# Patient Record
Sex: Male | Born: 1965
Health system: Southern US, Community
[De-identification: ages and names within clinical notes are randomized; demographics above are authoritative.]

## PROBLEM LIST (undated history)

## (undated) DIAGNOSIS — I10 Essential (primary) hypertension: Secondary | ICD-10-CM

## (undated) DIAGNOSIS — K219 Gastro-esophageal reflux disease without esophagitis: Secondary | ICD-10-CM

## (undated) DIAGNOSIS — Z860101 Personal history of adenomatous and serrated colon polyps: Secondary | ICD-10-CM

## (undated) DIAGNOSIS — N529 Male erectile dysfunction, unspecified: Secondary | ICD-10-CM

## (undated) DIAGNOSIS — K2210 Ulcer of esophagus without bleeding: Secondary | ICD-10-CM

## (undated) DIAGNOSIS — N200 Calculus of kidney: Secondary | ICD-10-CM

## (undated) DIAGNOSIS — E785 Hyperlipidemia, unspecified: Secondary | ICD-10-CM

## (undated) DIAGNOSIS — Z86010 Personal history of colonic polyps: Secondary | ICD-10-CM

## (undated) DIAGNOSIS — K449 Diaphragmatic hernia without obstruction or gangrene: Secondary | ICD-10-CM

## (undated) DIAGNOSIS — R7303 Prediabetes: Secondary | ICD-10-CM

## (undated) DIAGNOSIS — J45909 Unspecified asthma, uncomplicated: Secondary | ICD-10-CM

## (undated) DIAGNOSIS — R590 Localized enlarged lymph nodes: Secondary | ICD-10-CM

## (undated) DIAGNOSIS — F17200 Nicotine dependence, unspecified, uncomplicated: Secondary | ICD-10-CM

## (undated) HISTORY — DX: Localized enlarged lymph nodes: R59.0

## (undated) HISTORY — DX: Male erectile dysfunction, unspecified: N52.9

## (undated) HISTORY — DX: Essential (primary) hypertension: I10

## (undated) HISTORY — DX: Ulcer of esophagus without bleeding: K22.10

## (undated) HISTORY — DX: Nicotine dependence, unspecified, uncomplicated: F17.200

## (undated) HISTORY — PX: COLONOSCOPY: SHX174

## (undated) HISTORY — DX: Personal history of adenomatous and serrated colon polyps: Z86.0101

## (undated) HISTORY — DX: Prediabetes: R73.03

## (undated) HISTORY — DX: Gastro-esophageal reflux disease without esophagitis: K21.9

## (undated) HISTORY — DX: Diaphragmatic hernia without obstruction or gangrene: K44.9

## (undated) HISTORY — DX: Hyperlipidemia, unspecified: E78.5

## (undated) HISTORY — DX: Personal history of colonic polyps: Z86.010

## (undated) HISTORY — PX: BACK SURGERY: SHX140

## (undated) HISTORY — DX: Unspecified asthma, uncomplicated: J45.909

## (undated) HISTORY — DX: Calculus of kidney: N20.0

---

## 2009-06-25 HISTORY — PX: ESOPHAGOGASTRODUODENOSCOPY: SHX1529

## 2009-12-23 DIAGNOSIS — N200 Calculus of kidney: Secondary | ICD-10-CM

## 2009-12-23 HISTORY — DX: Calculus of kidney: N20.0

## 2010-01-02 ENCOUNTER — Encounter (INDEPENDENT_AMBULATORY_CARE_PROVIDER_SITE_OTHER): Payer: Self-pay | Admitting: *Deleted

## 2010-02-14 ENCOUNTER — Ambulatory Visit: Payer: Self-pay | Admitting: Internal Medicine

## 2010-02-14 DIAGNOSIS — K219 Gastro-esophageal reflux disease without esophagitis: Secondary | ICD-10-CM | POA: Insufficient documentation

## 2010-02-14 DIAGNOSIS — R1310 Dysphagia, unspecified: Secondary | ICD-10-CM | POA: Insufficient documentation

## 2010-03-15 ENCOUNTER — Ambulatory Visit: Payer: Self-pay | Admitting: Internal Medicine

## 2010-03-15 LAB — CONVERTED CEMR LAB: UREASE: NEGATIVE

## 2010-03-21 ENCOUNTER — Encounter (INDEPENDENT_AMBULATORY_CARE_PROVIDER_SITE_OTHER): Payer: Self-pay | Admitting: *Deleted

## 2010-05-03 ENCOUNTER — Ambulatory Visit: Payer: Self-pay | Admitting: Internal Medicine

## 2010-07-27 NOTE — Assessment & Plan Note (Signed)
Summary: Followup EGD-GERD   History of Present Illness Visit Type: follow up Primary GI MD: Yancey Flemings MD Primary Provider: Herb Grays, MD Chief Complaint: f/u EGD, pt states he has not had any heartburn or reflux since starting omeprazole. History of Present Illness:   Richard Winters presents for followup. He is a 45 year old with hypertension, kidney stones, and chronic back pain. He was evaluated February 14, 2010 regarding reflux and vague dysphagia. See that dictation. He subsequently underwent upper endoscopy on March 15, 2010 while on omeprazole 40 mg daily. Examination revealed erosive esophagitis with edema distally. No Barrett's. A 5 cm hiatal hernia. Mild duodenitis with negative H. pylori testing. He was advised with regards to antireflux regimen, told to quit smoking, and his omeprazole was increased to 40 mg b.i.d. He presents today for followup. Since his last visit, he reports complete resolution of symptoms. No reflux symptoms or any form of dysphasia. He is tolerating the medication well without apparent problems. We discussed reflux disease in detail as well as its treatment and complications. He has not stopped smoking.   GI Review of Systems      Denies abdominal pain, acid reflux, belching, bloating, chest pain, dysphagia with liquids, dysphagia with solids, heartburn, loss of appetite, nausea, vomiting, vomiting blood, weight loss, and  weight gain.        Denies anal fissure, black tarry stools, change in bowel habit, constipation, diarrhea, diverticulosis, fecal incontinence, heme positive stool, hemorrhoids, irritable bowel syndrome, jaundice, light color stool, liver problems, rectal bleeding, and  rectal pain.    Current Medications (verified): 1)  Omeprazole 40 Mg  Cpdr (Omeprazole) .Marland Kitchen.. 1 Twice A Day 30 Minutes Before Meals  Allergies (verified): No Known Drug Allergies  Past History:  Past Medical History: Esophageal Stricture GERD Hypertension Kidney  Stones Hiatal Hernia Erosive esophagitis  Past Surgical History: Reviewed history from 02/14/2010 and no changes required. Back Surgery  Family History: Reviewed history from 02/14/2010 and no changes required. Family History of Ovarian Cancer: Family History of Lung Cancer:  Family History of Celiac Disease: Mother Family History of Diabetes: Grandparents, Mat Uncle Family History of Heart Disease:   Social History: Reviewed history from 02/14/2010 and no changes required. 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 1ppd Alcohol Use - yes weekends Daily Caffeine Use 2-3 day Illicit Drug Use - no  Review of Systems  The patient denies allergy/sinus, anemia, anxiety-new, arthritis/joint pain, back pain, blood in urine, breast changes/lumps, change in vision, confusion, cough, coughing up blood, depression-new, fainting, fatigue, fever, headaches-new, hearing problems, heart murmur, heart rhythm changes, itching, menstrual pain, muscle pains/cramps, night sweats, nosebleeds, pregnancy symptoms, shortness of breath, skin rash, sleeping problems, sore throat, swelling of feet/legs, swollen lymph glands, thirst - excessive , urination - excessive , urination changes/pain, urine leakage, vision changes, and voice change.    Vital Signs:  Patient profile:   45 year old male Height:      73 inches Weight:      208.50 pounds BMI:     27.61 Pulse rate:   90 / minute Pulse rhythm:   regular BP sitting:   116 / 68  (left arm) Cuff size:   regular  Vitals Entered By: Christie Nottingham CMA Duncan Dull) (May 03, 2010 8:42 AM)  Physical Exam  General:  Well developed, well nourished, no acute distress. Head:  Normocephalic and atraumatic. Eyes:  PERRLA, no icterus. Lungs:  Clear throughout to auscultation. Heart:  Regular rate and  rhythm; no murmurs, rubs,  or bruits. Abdomen:  Soft, nontender and nondistended. No masses, hepatosplenomegaly or  hernias noted. Normal bowel sounds. Pulses:  Normal pulses noted. Neurologic:  are normoactive Skin:  no jaundice Psych:  Alert and cooperative. Normal mood and affect.   Impression & Recommendations:  Problem # 1:  GERD (ICD-530.81) GERD with endoscopic evidence of esophagitis and hiatal hernia. Discussion today on the pathophysiology of GERD as well as complications of the disorder and treatment. No further swallowing issues. He did require about decreasing his dosage of omeprazole to once daily.  Plan: #1. Reflux precautions. Literature provided on GERD and reflux precautions #2. Again advised to stop smoking #3. Continue omeprazole 40 mg b.i.d. for one additional month then decrease to 40 mg daily 30 minutes before his breakfast. If he has significant breakthrough symptoms on once daily dosage or recurrent dysphagia-type symptoms, then return to b.i.d. therapy #4. Routine GI followup in one year #5. Ongoing general medical care with Dr. Collins Scotland  Patient Instructions: 1)  GI Reflux brochure given.  2)  GI Reflux precautions given. 3)  Copy sent to : Herb Grays, MD 4)  Follow-up in 1 year. 5)  The medication list was reviewed and reconciled.  All changed / newly prescribed medications were explained.  A complete medication list was provided to the patient / caregiver.

## 2010-07-27 NOTE — Letter (Signed)
Summary: New Patient letter  [facility]  [address] [place], [place] [address]   Phone: [phone]  Fax: [phone]       01/02/2010 MRN: [id number]  [facility] [address] [place], [place]  [address]  Dear Richard Winters,  Welcome to the Gastroenterology Division at [facility].    You are scheduled to see Dr.  Marina Goodell on 02-14-10 at 9:15a.m. on the 3rd floor at [facility], [address]  We ask that you try to arrive at our office 15 minutes prior to your appointment time to allow for check-in.  We would like you to complete the enclosed self-administered evaluation form prior to your visit and bring it with you on the day of your appointment.  We will review it with you.  Also, please bring a complete list of all your medications or, if you prefer, bring the medication bottles and we will list them.  Please bring your insurance card so that we may make a copy of it.  If your insurance requires a referral to see a specialist, please bring your referral form from your primary care physician.  Co-payments are due at the time of your visit and may be paid by cash, check or credit card.     Your office visit will consist of a consult with your physician (includes a physical exam), any laboratory testing he/she may order, scheduling of any necessary diagnostic testing (e.g. x-ray, ultrasound, CT-scan), and scheduling of a procedure (e.g. Endoscopy, Colonoscopy) if required.  Please allow enough time on your schedule to allow for any/all of these possibilities.    If you cannot keep your appointment, please call [phone] to cancel or reschedule prior to your appointment date.  This allows [place] the opportunity to schedule an appointment for another patient in need of care.  If you do not cancel or reschedule by 5 p.m. the business day prior to your appointment date, you will be charged a $50.00 late cancellation/no-show fee.    Thank you for choosing  [facility] for your medical needs.  We appreciate the opportunity to care for you.  Please visit [place] at our website  to learn more about our practice.                     Sincerely,                                                             The Gastroenterology Division

## 2010-07-27 NOTE — Letter (Signed)
Summary: EGD Instructions  [facility]  [address] [place], [place] [address]   Phone: [phone]  Fax: [phone]       Richard Winters    May 06, 1966    MRN: [id number]       Procedure Day /Date:WEDNESDAY, 03/15/10     Arrival Time: 3:00 PM     Procedure Time:4:00 PM     Location of Procedure:                    X_ [facility] (4th Floor)   PREPARATION FOR ENDOSCOPY   On [facility] 9/21 THE DAY OF THE PROCEDURE:  1.   No solid foods, milk or milk products are allowed after midnight the night before your procedure.  2.   Do not drink anything colored red or purple.  Avoid juices with pulp.  No orange juice.  3.  You may drink clear liquids until 2:00 PM, which is 2 hours before your procedure.                                                                                                CLEAR LIQUIDS INCLUDE: Water Jello Ice Popsicles Tea (sugar ok, no milk/cream) Powdered fruit flavored drinks Coffee (sugar ok, no milk/cream) Gatorade Juice: apple, white grape, white cranberry  Lemonade Clear bullion, consomm, broth Carbonated beverages (any kind) Strained chicken noodle soup Hard Candy   MEDICATION INSTRUCTIONS  Unless otherwise instructed, you should take regular prescription medications with a small sip of water as early as possible the morning of your procedure.            OTHER INSTRUCTIONS  You will need a responsible adult at least 44 years of age to accompany you and drive you home.   This person must remain in the waiting room during your procedure.  Wear loose fitting clothing that is easily removed.  Leave jewelry and other valuables at home.  However, you may wish to bring a book to read or an iPod/MP3 player to listen to music as you wait for your procedure to start.  Remove all body piercing jewelry and leave at home.  Total time from sign-in until discharge is approximately 2-3 hours.  You should go home  directly after your procedure and rest.  You can resume normal activities the day after your procedure.  The day of your procedure you should not:   Drive   Make legal decisions   Operate machinery   Drink alcohol   Return to work  You will receive specific instructions about eating, activities and medications before you leave.    The above instructions have been reviewed and explained to me by   _______________________    I fully understand and can verbalize these instructions _____________________________ Date _________

## 2010-07-27 NOTE — Miscellaneous (Signed)
Summary: Omeprazole rx  Clinical Lists Changes  Medications: Added new medication of OMEPRAZOLE 40 MG  CPDR (OMEPRAZOLE) 1 twice a day 30 minutes before meals - Signed Rx of OMEPRAZOLE 40 MG  CPDR (OMEPRAZOLE) 1 twice a day 30 minutes before meals;  #60 x 11;  Signed;  Entered by: Karl Bales RN;  Authorized by: Hilarie Fredrickson MD;  Method used: [facility]  [place] [address], [place], [place]  [address], Ph: [phone] or [phone], Fax: [phone]    Prescriptions: OMEPRAZOLE 40 MG  CPDR (OMEPRAZOLE) 1 twice a day 30 minutes before meals  #60 x 11   Entered by:   Karl Bales RN   Authorized by:   Hilarie Fredrickson MD   Signed by:   Karl Bales RN on 03/15/2010   Method used:   [facility]        [facility]  [place] [address]retail)       [address]       [place], [place]  [address]       Ph: [phone] or [phone]       Fax: [phone]   RxID:   [id number]

## 2010-07-27 NOTE — Procedures (Signed)
Summary: Upper Endoscopy  Patient: [name] Note: All result statuses are Final unless otherwise noted.  Tests: (1) Upper Endoscopy (EGD)   EGD Upper Endoscopy       DONE     [facility]     [address]     [place], [place]  [address]           ENDOSCOPY PROCEDURE REPORT           PATIENT:  Richard Winters, Richard Winters  MR#:  [id number]     BIRTHDATE:  29-Jun-1965, 44 yrs. old  GENDER:  male           ENDOSCOPIST:  Wilhemina Bonito. Eda Keys, MD     Referred by:  Herb Grays, M.D.           PROCEDURE DATE:  03/15/2010     PROCEDURE:  EGD with biopsy     ASA CLASS:  Class I     INDICATIONS:  GERD, dysphagia (globus)           MEDICATIONS:   Fentanyl 100 mcg IV, Versed 10 mg IV     TOPICAL ANESTHETIC:  Exactacain Spray           DESCRIPTION OF PROCEDURE:   After the risks benefits and     alternatives of the procedure were thoroughly explained, informed     consent was obtained.  The LB [id number]-H180 [id number] endoscope was     introduced through the mouth and advanced to the second portion of     the duodenum, without limitations.  The instrument was slowly     withdrawn as the mucosa was fully examined.     <<PROCEDUREIMAGES>>           Erosive Esophagitis with edema was found in the distal esophagus.     No Barrett's.  A 5cm hiatal hernia was present.  Otherwise the     examination of the stomach was normal.  Duodenitis was found in     the bulb of the duodenum. The postbulbar duodenum was normal.CLO     bx taken.    Retroflexed views revealed the hiatal hernia.    The     scope was then withdrawn from the patient and the procedure     completed.           COMPLICATIONS:  None           ENDOSCOPIC IMPRESSION:     1) Erosive Esophagitis in the distal esophagus. No Barrett's     2) 5 cm Hiatal hernia     3) Otherwise normal examination of the stomach     4) Duodenitis in the bulb of duodenum     5) GERD           RECOMMENDATIONS:     1) Anti-reflux regimen to be followed  2) QUIT SMOKING     2) INCREASE OMEPRAZOLE TO 40MG  PO BID (#60 W/ 11 REFILLS)     3) Call office next 2-3 days to schedule an office appointment     WITH DR Marina Goodell FOR 6 WEEKS           ______________________________     Wilhemina Bonito. Eda Keys, MD           CC:  Herb Grays, MD, The Patient           n.     eSIGNEDWilhemina Bonito. Eda Keys at 03/15/2010 02:43 PM  [place], [place], [phone]  Note: An exclamation mark (!) indicates a result that was not dispersed into the flowsheet. Document Creation Date: 03/15/2010 2:44 PM _______________________________________________________________________  (1) Order result status: Final Collection or observation date-time: 03/15/2010 14:31 Requested date-time:  Receipt date-time:  Reported date-time:  Referring Physician:   Ordering Physician: Fransico Setters [phone]) Specimen Source:  Source: Launa Grill Order Number: [id number] Lab site:   Appended Document: Upper Endoscopy ROV made and letter mailed

## 2010-07-27 NOTE — Assessment & Plan Note (Signed)
Summary: problems swollowing--ch.   History of Present Illness Visit Type: new patient Primary GI MD: Yancey Flemings MD Primary Provider: Herb Grays, MD Chief Complaint: no dysphagia, but feels a lump in his throat when swallowing History of Present Illness:   45 year-old white male with hypertension, renal nephrolithiasis, and back pain. He presents today regarding dysphagia. Patient reports having a long history of acid reflux type symptoms manifested by pyrosis and occasional chest discomfort. For this he would used large quantities of TUMS with relief. However less problems recently with reflux-type symptoms. He now mentions a globus type sensation with swallowing he reports the sensation of a lump present. Some vague esophageal dysphagia. He is on the PPI. His weight has been stable. No palmar pain or lower GI complaints. His mother has a history of peptic stricture. The patient smokes. No prior GI evaluations.   GI Review of Systems    Reports acid reflux, belching, chest pain, and  heartburn.      Denies abdominal pain, bloating, dysphagia with liquids, dysphagia with solids, loss of appetite, nausea, vomiting, vomiting blood, weight loss, and  weight gain.        Denies anal fissure, black tarry stools, change in bowel habit, constipation, diarrhea, diverticulosis, fecal incontinence, heme positive stool, hemorrhoids, irritable bowel syndrome, jaundice, light color stool, liver problems, rectal bleeding, and  rectal pain. Preventive Screening-Counseling & Management  Alcohol-Tobacco     Smoking Status: current      Drug Use:  no.      Current Medications (verified): 1)  Vicodin 5-500 Mg Tabs (Hydrocodone-Acetaminophen) .... As Directed For Kidney Stone Pain 2)  Advil 200 Mg Tabs (Ibuprofen) .... 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 1ppd Alcohol Use - yes weekends Daily Caffeine Use 2-3 day Illicit Drug Use - no Smoking Status:  current Drug Use:  no  Review of Systems       The patient complains of back pain and sore throat.  The patient denies allergy/sinus, anemia, anxiety-new, arthritis/joint pain, blood in urine, breast changes/lumps, confusion, cough, coughing up blood, depression-new, fainting, fatigue, fever, headaches-new, hearing problems, heart murmur, heart rhythm changes, itching, muscle pains/cramps, night sweats, nosebleeds, shortness of breath, skin rash, sleeping problems, swelling of feet/legs, swollen lymph glands, thirst - excessive, urination - excessive, urination changes/pain, urine leakage, vision changes, and voice change.    Vital Signs:  Patient profile:   45 year old male Height:      73 inches Weight:      209 pounds BMI:     27.67 Pulse rate:   92 / minute Pulse rhythm:   regular BP sitting:   134 / 90  (left arm) Cuff size:   regular  Vitals Entered By: Francee Piccolo CMA Duncan Dull) (February 14, 2010 9:23 AM)  Physical Exam  General:  Well developed, well nourished, no acute distress. Head:  Normocephalic and atraumatic. Eyes:  PERRLA, no icterus. Ears:  Normal auditory acuity. Nose:  No deformity, discharge,  or lesions. Mouth:  No deformity or lesions, dentition  normal. Neck:  Supple; no masses or thyromegaly. Lungs:  Clear throughout to auscultation. Heart:  Regular rate and rhythm; no murmurs, rubs,  or bruits. Abdomen:  Soft, nontender and nondistended. No masses,  hepatosplenomegaly or hernias noted. Normal bowel sounds. Msk:  Symmetrical with no gross deformities. Normal posture. Pulses:  Normal pulses noted. Extremities:  No clubbing, cyanosis, edema or deformities noted. Neurologic:  Alert and  oriented x4;  grossly normal neurologically. Skin:  Intact without significant lesions or rashes. Psych:  Alert and cooperative. Normal mood and affect.   Impression & Recommendations:  Problem # 1:  DYSPHAGIA UNSPECIFIED (ICD-33.67) 45 year-old with chronic GERD who presents with dysphagia, mostly globus-type symptoms. This may be secondary to GERD. Rule out underlying structural lesion.  Plan:  #1.upper endoscopy with possible esophageal dilation. The nature of the procedure as well as the risks, benefits, and alternatives were reviewed. She understood and agreed to proceed.  Problem # 2:  GERD (ICD-530.81) long-standing GERD symptoms. Seemingly improve nonspecifically. Esophageal symptoms may be secondary to GERD. Rule out underlying peptic stricture. Rule out Barrett's esophagus.  Plan: #1. Reflux precautions #2. Stop smoking #3. Omeprazole 40 mg daily prescribed #4. Upper endoscopy  Other Orders: EGD (EGD)  Patient Instructions: 1)  EGD [facility] 03/15/10 4:00 pm arrive at 3:00 pm 2)  Upper Endoscopy brochure given.  3)  GI Reflux brochure given.  4)  Rx. for Omeprazole 40 mg 1 by mouth once daily #30 x 11 RFs. sent to your pharmacy for you to pick up. 5)  The medication list was reviewed and reconciled.  All changed / newly prescribed medications were explained.  A complete medication list was provided to the patient / caregiver. Prescriptions: OMEPRAZOLE 40 MG CPDR (OMEPRAZOLE) 1 by mouth once daily  #30 x 11   Entered by:   Milford Cage NCMA   Authorized by:   Hilarie Fredrickson MD   Signed by:   Milford Cage NCMA on 02/14/2010   Method used:   [facility]  [place] [address] [facility]       [address]        [place], [place]  [address]       Ph: [phone] or [phone]       Fax: [phone]   RxID:   [id number]

## 2010-07-27 NOTE — Letter (Signed)
Summary: Appt Reminder 2  [facility]  [address] [place], [place] [address]   Phone: [phone]  Fax: [phone]        March 21, 2010 MRN: [id number]    [facility] [address] [place], [place]  [address]    Dear Mr. Pozo,   You have a return appointment with Dr. Marina Goodell on 05/03/10 at 8:45 am.  Please remember to bring a complete list of the medicines you are taking, your insurance card and your co-pay.  If you have to cancel or reschedule this appointment, please call before 5:00 pm the evening before to avoid a cancellation fee.  If you have any questions or concerns, please call [phone].    Sincerely,    Chales Abrahams CMA (AAMA)  Appended Document: Appt Reminder 2 letter mailed

## 2010-07-27 NOTE — Miscellaneous (Signed)
Summary: clotest  Clinical Lists Changes  Orders: Added new Test order of TLB-H Pylori Screen Gastric Biopsy (83013-CLOTEST) - Signed 

## 2011-03-16 ENCOUNTER — Other Ambulatory Visit: Payer: Self-pay | Admitting: Internal Medicine

## 2012-01-25 ENCOUNTER — Ambulatory Visit (INDEPENDENT_AMBULATORY_CARE_PROVIDER_SITE_OTHER): Payer: Managed Care, Other (non HMO) | Admitting: Family Medicine

## 2012-01-25 ENCOUNTER — Encounter: Payer: Self-pay | Admitting: Family Medicine

## 2012-01-25 VITALS — BP 146/93 | HR 70 | Temp 97.7°F | Ht 73.0 in | Wt 212.0 lb

## 2012-01-25 DIAGNOSIS — K8020 Calculus of gallbladder without cholecystitis without obstruction: Secondary | ICD-10-CM

## 2012-01-25 DIAGNOSIS — N200 Calculus of kidney: Secondary | ICD-10-CM

## 2012-01-25 DIAGNOSIS — R590 Localized enlarged lymph nodes: Secondary | ICD-10-CM

## 2012-01-25 DIAGNOSIS — R599 Enlarged lymph nodes, unspecified: Secondary | ICD-10-CM

## 2012-01-25 LAB — CBC WITH DIFFERENTIAL/PLATELET
Eosinophils Relative: 2.8 % (ref 0.0–5.0)
HCT: 49.2 % (ref 39.0–52.0)
Lymphocytes Relative: 27.5 % (ref 12.0–46.0)
Lymphs Abs: 1.8 10*3/uL (ref 0.7–4.0)
Monocytes Relative: 11.9 % (ref 3.0–12.0)
Neutrophils Relative %: 57.2 % (ref 43.0–77.0)
Platelets: 220 10*3/uL (ref 150.0–400.0)
WBC: 6.7 10*3/uL (ref 4.5–10.5)

## 2012-01-25 LAB — COMPREHENSIVE METABOLIC PANEL
CO2: 29 mEq/L (ref 19–32)
Calcium: 9.6 mg/dL (ref 8.4–10.5)
Chloride: 103 mEq/L (ref 96–112)
Creatinine, Ser: 0.8 mg/dL (ref 0.4–1.5)
GFR: 110.94 mL/min (ref >60.00)
Glucose, Bld: 82 mg/dL (ref 70–99)
Total Bilirubin: 1.1 mg/dL (ref 0.3–1.2)

## 2012-01-25 LAB — SEDIMENTATION RATE: Sed Rate: 3 mm/hr (ref 0–22)

## 2012-01-25 LAB — URIC ACID: Uric Acid, Serum: 5.3 mg/dL (ref 4.0–7.8)

## 2012-01-25 NOTE — Assessment & Plan Note (Signed)
Reassured pt that nothing needs to be done about this unless he becomes symptomatic. We reviewed the symptoms of biliary colic.

## 2012-01-25 NOTE — Assessment & Plan Note (Signed)
Recent acute episode, now passed and completely free of pain. He'll keep routine f/u with [facility].

## 2012-01-25 NOTE — Assessment & Plan Note (Signed)
Will obtain the reading of the CT scan he had done at [facility] recently so I can better determine a course of action. Based on current info available to me, I will check iFOB, CBC, CMET, ESR, and uric acid level.

## 2012-01-25 NOTE — Progress Notes (Signed)
Office Note 01/25/2012  CC:  Chief Complaint  Patient presents with  . Establish Care    swollen lymph nodes, gallstones    HPI:  Richard Winters is a 46 y.o. White male who is here to establish care and discuss some issues. Patient's most recent primary MD: [facility] in [place]. Old records (GI notes and EGD in EPIC/HL) were reviewed prior to or during today's visit.  Pt's recent CT scan w/out contrast was reviewed after today's visit.  Describes episode of kidney stones last week, passed it after a couple of days.  He was rx'd rapaflo for short term use, also oxycodone (says he only took 2-3 of these).  He says his HCTZ was also rx'd in distant past for diuretic purposes for his stone dz, not for htn--says he takes it qod b/c he's fearful of the med causing gout.  He maintains that he has white coat HTN, and says he has never had a high bp reading at home or a nonmedical facility. He says the noncontrast CT abd done at [facility] that he had showed "enlarged lymph nodes".  He also was told in the past he has gallstones and denies any sx's suggestive of gallstones.  ROS: no undesired wt loss, no fevers.  Appetite is good.  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 No abd pain, no achiness, no edema, no CP, no SOB, no cough, no URI/ST.  Past Medical History  Diagnosis Date  . Hypertension   . Chest pain   . Dyslipidemia   . Tobacco dependence   . GERD (gastroesophageal reflux disease)     s/p EGD 02/2010-erosive esophagitis, no barrett's, H. pylori neg--responded well to PPI  . Nephrolithiasis     Past Surgical History  Procedure Date  . Back surgery age 2 yrs    Lumbar -2 levels (no hardware)--no signif chronic back problems since  . Esophagogastroduodenoscopy 2011    Esophagitis, H. pylori neg, no Barrett's.    Family History  Problem Relation Age of Onset  . Heart attack Father   . Hypertension Father   . Diabetes Maternal Grandmother   . Diabetes Maternal  Grandfather     History   Social History  . Marital Status: Married    Spouse Name: N/A    Number of Children: N/A  . Years of Education: N/A   Occupational History  . Not on file.   Social History Main Topics  . Smoking status: Current Everyday Smoker  . Smokeless tobacco: Never Used  . Alcohol Use: Yes  . Drug Use: No  . Sexually Active: Not on file   Other Topics Concern  . 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  Occ rides 4 wheelers.Orig from [place] and went to [employer].    Outpatient Encounter Prescriptions as of 01/25/2012  Medication Sig Dispense Refill  . B Complex-C (B-COMPLEX WITH VITAMIN C) tablet Take 1 tablet by mouth daily.      . Coenzyme Q10 200 MG capsule Take 200 mg by mouth daily.      . hydrochlorothiazide (HYDRODIURIL) 25 MG tablet Take 1 tablet by mouth Daily.      Marland Kitchen lisinopril (PRINIVIL,ZESTRIL) 20 MG tablet Take 20 mg by mouth every other day.       . Omega 3-6-9 Fatty Acids (TRIPLE OMEGA-3-6-9) CAPS Take 2 capsules by mouth daily.      Marland Kitchen  omeprazole (PRILOSEC) 40 MG capsule TAKE ONE CAPSULE BY MOUTH TWICE A DAY 30 MINUTES BEFORE MEALS  60 capsule  6  . zinc gluconate 50 MG tablet Take 50 mg by mouth daily.      Marland Kitchen aspirin 81 MG tablet Take 81 mg by mouth daily.        No Known Allergies  ROS Review of Systems  Constitutional: Negative for fever, chills, appetite change and fatigue.  HENT: Negative for ear pain, congestion, sore throat, neck stiffness and dental problem.   Eyes: Negative for discharge, redness and visual disturbance.  Respiratory: Negative for cough, chest tightness, shortness of breath and wheezing.   Cardiovascular: Negative for chest pain, palpitations and leg swelling.  Gastrointestinal: Negative for nausea, vomiting, abdominal pain, diarrhea, constipation, blood  in stool, abdominal distention, anal bleeding and rectal pain.  Genitourinary:       See HPI  Musculoskeletal: Negative for myalgias, back pain, joint swelling and arthralgias.  Skin: Negative for pallor and rash.  Neurological: Negative for dizziness, speech difficulty, weakness and headaches.  Hematological: Negative for adenopathy. Does not bruise/bleed easily.  Psychiatric/Behavioral: Negative for confusion and disturbed wake/sleep cycle. The patient is not nervous/anxious.     PE; Blood pressure 146/93, pulse 70, temperature 97.7 F (36.5 C), temperature source Temporal, height 6\' 1"  (1.854 m), weight 212 lb (96.163 kg), SpO2 99.00%. Gen: Alert, well appearing.  Patient is oriented to person, place, time, and situation. Affect: pleasant, lucid thought and speech ENT: Ears: EACs clear, normal epithelium.  TMs with good light reflex and landmarks bilaterally.  Eyes: no injection, icteris, swelling, or exudate.  EOMI, PERRLA. Nose: no drainage or turbinate edema/swelling.  No injection or focal lesion.  Mouth: lips without lesion/swelling.  Oral mucosa pink and moist.  Dentition intact and without obvious caries or gingival swelling.  Oropharynx without erythema, exudate, or swelling.  Neck - No masses or thyromegaly or limitation in range of motion CV: RRR, no m/r/g.   LUNGS: CTA bilat, nonlabored resps, good aeration in all lung fields. ABD: soft, NT, ND, BS normal.  No hepatospenomegaly or mass.  No bruits. EXT: no clubbing, cyanosis, or edema.   Pertinent labs:  None today  ASSESSMENT AND PLAN:   New pt: obtain old records  Nephrolithiasis Recent acute episode, now passed and completely free of pain. He'll keep routine f/u with [facility].  Abdominal lymphadenopathy Will obtain the reading of the CT scan he had done at [facility] recently so I can better determine a course of action. Based on current info available to me, I will check iFOB, CBC, CMET, ESR, and  uric acid level.  Asymptomatic gallstones Reassured pt that nothing needs to be done about this unless he becomes symptomatic. We reviewed the symptoms of biliary colic.    Return in about 6 months (around 07/27/2012) for f/u mesenteric LAD; needs repeat CT abd/pelvis.   ADDENDUM 01/26/12: I reviewed the noncontrast CT done 01/16/12 at [facility].  All was normal except a nonobstructing stone in left kidney and a stone at the left UP jxn. He was also noted to have borderline enlarged mesenteric lymph nodes in the ileocolic area.  Radiologist commented that this was very likely reactive from prior infectious/inflammitory process, but lymphoproliferative disorder (lymphoma) or metastatic adenopathy could not completely be excluded. It was recommended to get hemoccult testing and repeat CT scan in 6 mo. I called the patient today and notified him of these results and our plan is to have  him return for o/v in 50mo and see how he's doing and then order the repeat CT scan.---PM

## 2012-01-31 ENCOUNTER — Encounter: Payer: Self-pay | Admitting: Family Medicine

## 2012-02-18 ENCOUNTER — Other Ambulatory Visit: Payer: Managed Care, Other (non HMO)

## 2012-02-18 DIAGNOSIS — R590 Localized enlarged lymph nodes: Secondary | ICD-10-CM

## 2012-02-18 LAB — FECAL OCCULT BLOOD, IMMUNOCHEMICAL: Fecal Occult Bld: NEGATIVE

## 2012-04-10 ENCOUNTER — Other Ambulatory Visit: Payer: Self-pay | Admitting: Internal Medicine

## 2012-05-06 ENCOUNTER — Ambulatory Visit (INDEPENDENT_AMBULATORY_CARE_PROVIDER_SITE_OTHER): Payer: Managed Care, Other (non HMO) | Admitting: Family Medicine

## 2012-05-06 ENCOUNTER — Encounter: Payer: Self-pay | Admitting: Family Medicine

## 2012-05-06 VITALS — BP 142/87 | HR 68 | Ht 73.0 in | Wt 219.0 lb

## 2012-05-06 DIAGNOSIS — R198 Other specified symptoms and signs involving the digestive system and abdomen: Secondary | ICD-10-CM

## 2012-05-06 NOTE — Progress Notes (Signed)
OFFICE NOTE  05/08/2012  CC:  Chief Complaint  Patient presents with  . Cholelithiasis    worse since Friday, worse when eating or sitting     HPI: Patient is a 46 y.o. Caucasian male who is here for abdominal complaints. Vague report: no pain, but mild RUQ fullness with lifting/straining and as he eats.  No nausea. He is able to eat fine.  He just finished 7d cruise and ate a lot.  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  He was worried b/c he thought he remembered being told that he had gallstones seen on his CT scan of abd w/out contrast 12/2011 done for eval of kidney stones.   However, in review of this report today there is mention of NORMAL GB.  Pertinent PMH:  Past Medical History  Diagnosis Date  . Elevated blood pressure reading without diagnosis of hypertension     white coat HTN?  Marland Kitchen Chest pain   . Dyslipidemia   . Tobacco dependence   . GERD (gastroesophageal reflux disease)     s/p EGD 02/2010-erosive esophagitis, no barrett's, H. pylori neg--responded well to PPI  . Nephrolithiasis     with hypercalcuria (treated w/HCTZ)    MEDS:  Outpatient Prescriptions Prior to Visit  Medication Sig Dispense Refill  . aspirin 81 MG tablet Take 81 mg by mouth every other day.       . B Complex-C (B-COMPLEX WITH VITAMIN C) tablet Take 1 tablet by mouth daily.      . Coenzyme Q10 200 MG capsule Take 200 mg by mouth daily.      . hydrochlorothiazide (HYDRODIURIL) 25 MG tablet Take 1 tablet by mouth every other day.       . Omega 3-6-9 Fatty Acids (TRIPLE OMEGA-3-6-9) CAPS Take 2 capsules by mouth daily.      Marland Kitchen omeprazole (PRILOSEC) 40 MG capsule TAKE ONE CAPSULE BY MOUTH TWICE A DAY 30 MINUTES BEFORE MEALS  60 capsule  0  . zinc gluconate 50 MG tablet Take 50 mg by mouth daily.      Marland Kitchen lisinopril (PRINIVIL,ZESTRIL) 20 MG tablet Take 20 mg by mouth every other day.       NOTE**He is not taking lisinopril as listed  above  PE: Blood pressure 142/87, pulse 68, height 6\' 1"  (1.854 m), weight 219 lb (99.338 kg). Gen: Alert, well appearing.  Patient is oriented to person, place, time, and situation. ENT:  Eyes: no injection, icteris, swelling, or exudate.   Oral mucosa pink and moist.    Oropharynx without erythema, exudate, or swelling.  Neck - No masses or thyromegaly or limitation in range of motion CV: RRR, no m/r/g.   LUNGS: CTA bilat, nonlabored resps, good aeration in all lung fields. ABD: soft, NT, +rotund/obese but ND, BS normal.  No hepatospenomegaly or mass.  No bruits. EXT: no clubbing, cyanosis, or edema.   LABS: none today  IMPRESSION AND PLAN:  Abdominal fullness in right upper quadrant I think he is experiencing some dyspepsia sx's and sx's compatible with his small sliding hiatal hernia. We reviewed his CT w/out contrast from 12/2011 and noted normal GB and liver. He had a bit of ileocolic mesenteric lymph nodes that were borderline in size and we plan on doing f/u CT as recommended by radiologist (hemoccults were neg) approx Jan/Feb 2014.   Reassured pt.  Continue daily prilosec.  Avoid overeating, avoid any potentially culprit foods. Call or  return if getting more discrete postprandial RUQ or mid epigastric pain, nausea, or vomiting.   He declined flu vaccine today.  An After Visit Summary was printed and given to the patient.  FOLLOW UP: 1-2 mo

## 2012-05-08 ENCOUNTER — Encounter: Payer: Self-pay | Admitting: Family Medicine

## 2012-05-08 NOTE — Assessment & Plan Note (Addendum)
I think he is experiencing some dyspepsia sx's and sx's compatible with his small sliding hiatal hernia. We reviewed his CT w/out contrast from 12/2011 and noted normal GB and liver. He had a bit of ileocolic mesenteric lymph nodes that were borderline in size and we plan on doing f/u CT as recommended by radiologist (hemoccults were neg) approx Jan/Feb 2014.   Reassured pt.  Continue daily prilosec.  Avoid overeating, avoid any potentially culprit foods. Call or return if getting more discrete postprandial RUQ or mid epigastric pain, nausea, or vomiting.

## 2012-05-14 ENCOUNTER — Other Ambulatory Visit: Payer: Self-pay | Admitting: Internal Medicine

## 2012-06-04 ENCOUNTER — Ambulatory Visit: Payer: Managed Care, Other (non HMO) | Admitting: Internal Medicine

## 2012-06-19 ENCOUNTER — Ambulatory Visit: Payer: Managed Care, Other (non HMO) | Admitting: Internal Medicine

## 2012-07-07 ENCOUNTER — Encounter: Payer: Self-pay | Admitting: Family Medicine

## 2012-07-07 ENCOUNTER — Ambulatory Visit (INDEPENDENT_AMBULATORY_CARE_PROVIDER_SITE_OTHER): Payer: Managed Care, Other (non HMO) | Admitting: Family Medicine

## 2012-07-07 VITALS — BP 125/83 | HR 72 | Ht 73.0 in | Wt 218.0 lb

## 2012-07-07 DIAGNOSIS — R599 Enlarged lymph nodes, unspecified: Secondary | ICD-10-CM

## 2012-07-07 DIAGNOSIS — K219 Gastro-esophageal reflux disease without esophagitis: Secondary | ICD-10-CM

## 2012-07-07 DIAGNOSIS — I880 Nonspecific mesenteric lymphadenitis: Secondary | ICD-10-CM

## 2012-07-07 DIAGNOSIS — R590 Localized enlarged lymph nodes: Secondary | ICD-10-CM

## 2012-07-07 MED ORDER — OMEPRAZOLE 40 MG PO CPDR: 40.0000 mg | DELAYED_RELEASE_CAPSULE | Freq: Every day | ORAL | Status: DC

## 2012-07-07 NOTE — Progress Notes (Signed)
OFFICE NOTE  07/07/2012  CC:  Chief Complaint  Patient presents with  . Follow-up    CT scan     HPI: Patient is a 47 y.o. Caucasian male who is here for 6 mo f/u abnl abdominal CT--borderline mesenteric lymphadenopathy noted, thought to be reactive but f/u CT in 78mo was recommended. Since last visit 2 mo/ago he has had much less epigastric/RUQ pain and he attributes this to taking his lunch to work daily.  He has cut back his omeprazole from bid to qd.  "I can tell when I miss it". Appetite is good.  No unexplained fevers, night sweats, or wt loss.  Pertinent PMH:  Past Medical History  Diagnosis Date  . HTN (hypertension)   . Chest pain   . Dyslipidemia   . Tobacco dependence   . GERD (gastroesophageal reflux disease)     s/p EGD 02/2010-erosive esophagitis, no barrett's, H. pylori neg--responded well to PPI  . Nephrolithiasis 12/2009    with hypercalcuria (treated w/HCTZ)  . Hiatal hernia   . Erosive esophagitis     MEDS:  Outpatient Prescriptions Prior to Visit  Medication Sig Dispense Refill  . aspirin 81 MG tablet Take 81 mg by mouth every other day.       . B Complex-C (B-COMPLEX WITH VITAMIN C) tablet Take 1 tablet by mouth daily.      . Coenzyme Q10 200 MG capsule Take 200 mg by mouth daily.      . hydrochlorothiazide (HYDRODIURIL) 25 MG tablet Take 1 tablet by mouth every other day.       . Omega 3-6-9 Fatty Acids (TRIPLE OMEGA-3-6-9) CAPS Take 2 capsules by mouth daily.      Marland Kitchen omeprazole (PRILOSEC) 40 MG capsule TAKE ONE CAPSULE BY MOUTH TWICE A DAY 30 MINS BEFORE MEALS  60 capsule  0  . zinc gluconate 50 MG tablet Take 50 mg by mouth daily.       Last reviewed on 05/06/2012  8:22 AM by Jeoffrey Massed, MD  PE: Blood pressure 125/83, pulse 72, height 6\' 1"  (1.854 m), weight 218 lb (98.884 kg). Gen: Alert, well appearing.  Patient is oriented to person, place, time, and situation. CV: RRR, no m/r/g.   LUNGS: CTA bilat, nonlabored resps, good aeration in all  lung fields. ABD: soft, NT, ND, BS normal.  No hepatospenomegaly or mass.  No bruits.  IMPRESSION AND PLAN:  Abdominal lymphadenopathy Asymptomatic. Time for a 78mo f/u CT abd/pelv--ordered today.  GERD Hx of esophagitis approx 2 yrs ago on EGD. This has greatly improved and he is on once daily prilosec. Dietary adjustments have been made as well. RF'd prilosec 40mg  qd today.   An After Visit Summary was printed and given to the patient.  FOLLOW UP:  1 yr

## 2012-07-07 NOTE — Assessment & Plan Note (Signed)
Hx of esophagitis approx 2 yrs ago on EGD. This has greatly improved and he is on once daily prilosec. Dietary adjustments have been made as well. RF'd prilosec 40mg  qd today.

## 2012-07-07 NOTE — Assessment & Plan Note (Signed)
Asymptomatic. Time for a 51mo f/u CT abd/pelv--ordered today.

## 2012-07-11 ENCOUNTER — Ambulatory Visit: Payer: Managed Care, Other (non HMO) | Admitting: Internal Medicine

## 2012-07-17 ENCOUNTER — Telehealth: Payer: Self-pay | Admitting: Family Medicine

## 2012-07-17 NOTE — Telephone Encounter (Signed)
Pt's wife in today for visit and she asked about result of Richard Winters CT abd done about 4d/a. Apparently this was ordered for med [facility] but his insurance required it to be done at [facility] in [place]. We never got a result sent to [place].  We got the result faxed to [place] today while his wife was here and (ok per his DPR) we gave her the result and she said she would tell him: no change in previously noted lymph nodes, not pathologically enlarged.  Prior infectious or inflammitory etiology is favored, less likely neoplastic. I told her no further imaging was needed unless symptoms occurred.   Will place faxed report in patient's chart.

## 2012-10-08 ENCOUNTER — Encounter: Payer: Self-pay | Admitting: Family Medicine

## 2012-10-08 ENCOUNTER — Ambulatory Visit (INDEPENDENT_AMBULATORY_CARE_PROVIDER_SITE_OTHER): Payer: Managed Care, Other (non HMO) | Admitting: Family Medicine

## 2012-10-08 VITALS — BP 124/80 | HR 63 | Temp 97.8°F | Ht 73.0 in | Wt 217.2 lb

## 2012-10-08 DIAGNOSIS — B351 Tinea unguium: Secondary | ICD-10-CM | POA: Insufficient documentation

## 2012-10-08 DIAGNOSIS — N529 Male erectile dysfunction, unspecified: Secondary | ICD-10-CM | POA: Insufficient documentation

## 2012-10-08 MED ORDER — TERBINAFINE HCL 250 MG PO TABS: 250.0000 mg | ORAL_TABLET | Freq: Every day | ORAL | Status: DC

## 2012-10-08 MED ORDER — SILDENAFIL CITRATE 100 MG PO TABS: 100.0000 mg | ORAL_TABLET | Freq: Every day | ORAL | Status: DC | PRN

## 2012-10-08 NOTE — Progress Notes (Signed)
OFFICE NOTE  10/08/2012  CC: No chief complaint on file.   HPI: Patient is a 47 y.o. Caucasian male who is here discussion of toenail problem. Thickened great toenails with white substance underneath for about 1 yr.  Other nails ok. Has had this treated successfully in the past and it has been back now for about a year. 01/2012 LFTs normal.  ED: erection ok at first but it won't last, intermittently everything is ok.  Has been a problem for about a year. Sexual desire intact.  Says he has never tried any ED med in the past.  Pertinent PMH:  Past Medical History  Diagnosis Date  . HTN (hypertension)   . Chest pain   . Dyslipidemia   . Tobacco dependence   . GERD (gastroesophageal reflux disease)     s/p EGD 02/2010-erosive esophagitis, no barrett's, H. pylori neg--responded well to PPI  . Nephrolithiasis 12/2009    with hypercalcuria (treated w/HCTZ)  . Hiatal hernia   . Erosive esophagitis   . Abdominal lymphadenopathy     CT f/u 20mo later (06/2012) all was stable: interpreted as reactive/post-infectious-no further f/u indicated.    MEDS:  Outpatient Prescriptions Prior to Visit  Medication Sig Dispense Refill  . aspirin 81 MG tablet Take 81 mg by mouth every other day.       . B Complex-C (B-COMPLEX WITH VITAMIN C) tablet Take 1 tablet by mouth daily.      . Coenzyme Q10 200 MG capsule Take 200 mg by mouth daily.      . hydrochlorothiazide (HYDRODIURIL) 25 MG tablet Take 1 tablet by mouth every other day.       . Omega 3-6-9 Fatty Acids (TRIPLE OMEGA-3-6-9) CAPS Take 2 capsules by mouth daily.      Marland Kitchen omeprazole (PRILOSEC) 40 MG capsule Take 1 capsule (40 mg total) by mouth daily.  90 capsule  1  . zinc gluconate 50 MG tablet Take 50 mg by mouth daily.       No facility-administered medications prior to visit.    PE: Blood pressure 124/80, pulse 63, temperature 97.8 F (36.6 C), temperature source Oral, height 6\' 1"  (1.854 m), weight 217 lb 4 oz (98.544 kg), SpO2  97.00%. Gen: Alert, well appearing.  Patient is oriented to person, place, time, and situation. FEET: both great toes with thickened nails with extensive white substance beneath nails.  Other toenails looked normal.  Feet without rash.  IMPRESSION AND PLAN:  Onychomycosis Lamisil 250mg  qd x 3 mo. Therapeutic expectations and side effect profile of medication discussed today.  Patient's questions answered.   Erectile dysfunction Trial of viagra; coupon for 3 free pills given today, rx for #3 with 3 RFs.   An After Visit Summary was printed and given to the patient.   FOLLOW UP: 20mo f/u HTN

## 2012-10-08 NOTE — Assessment & Plan Note (Signed)
Lamisil 250mg  qd x 3 mo. Therapeutic expectations and side effect profile of medication discussed today.  Patient's questions answered.

## 2012-10-08 NOTE — Assessment & Plan Note (Signed)
Trial of viagra; coupon for 3 free pills given today, rx for #3 with 3 RFs.

## 2013-01-26 ENCOUNTER — Other Ambulatory Visit: Payer: Self-pay | Admitting: Family Medicine

## 2013-01-26 MED ORDER — SILDENAFIL CITRATE 100 MG PO TABS: 100.0000 mg | ORAL_TABLET | Freq: Every day | ORAL | Status: DC | PRN

## 2013-01-26 NOTE — Telephone Encounter (Signed)
Patient seen on 10/08/12.  Refilled viagra 100mg  for 12 months per protocol.

## 2013-02-02 ENCOUNTER — Other Ambulatory Visit: Payer: Self-pay | Admitting: Family Medicine

## 2013-02-02 MED ORDER — OMEPRAZOLE 40 MG PO CPDR: 40.0000 mg | DELAYED_RELEASE_CAPSULE | Freq: Every day | ORAL | Status: DC

## 2013-02-18 ENCOUNTER — Ambulatory Visit (INDEPENDENT_AMBULATORY_CARE_PROVIDER_SITE_OTHER): Payer: Managed Care, Other (non HMO) | Admitting: Family Medicine

## 2013-02-18 ENCOUNTER — Encounter: Payer: Self-pay | Admitting: Family Medicine

## 2013-02-18 VITALS — BP 127/85 | HR 71 | Temp 98.6°F | Resp 18 | Ht 73.0 in | Wt 217.0 lb

## 2013-02-18 DIAGNOSIS — L309 Dermatitis, unspecified: Secondary | ICD-10-CM | POA: Insufficient documentation

## 2013-02-18 DIAGNOSIS — B351 Tinea unguium: Secondary | ICD-10-CM

## 2013-02-18 DIAGNOSIS — L259 Unspecified contact dermatitis, unspecified cause: Secondary | ICD-10-CM

## 2013-02-18 DIAGNOSIS — N529 Male erectile dysfunction, unspecified: Secondary | ICD-10-CM

## 2013-02-18 MED ORDER — SILDENAFIL CITRATE 50 MG PO TABS: 50.0000 mg | ORAL_TABLET | Freq: Every day | ORAL | Status: DC | PRN

## 2013-02-18 NOTE — Progress Notes (Signed)
OFFICE NOTE  02/18/2013  CC:  Chief Complaint  Patient presents with  . Follow-up     HPI: Patient is a 47 y.o. white male who is here for 4 mo f/u onychomycosis + tinea cruris s/p lamisil treatment. Nails improved.  No probs with lamisil. Viagra trial successful, 50mg  dose fine.   Pertinent PMH:  Past Medical History  Diagnosis Date  . HTN (hypertension)   . Chest pain   . Dyslipidemia   . Tobacco dependence   . GERD (gastroesophageal reflux disease)     s/p EGD 02/2010-erosive esophagitis, no barrett's, H. pylori neg--responded well to PPI  . Nephrolithiasis 12/2009    with hypercalcuria (treated w/HCTZ)  . Hiatal hernia   . Erosive esophagitis   . Abdominal lymphadenopathy     CT f/u 38mo later (06/2012) all was stable: interpreted as reactive/post-infectious-no further f/u indicated.   Past surgical, social, and family history reviewed and no changes noted since last office visit.  MEDS:  Outpatient Prescriptions Prior to Visit  Medication Sig Dispense Refill  . B Complex-C (B-COMPLEX WITH VITAMIN C) tablet Take 1 tablet by mouth daily.      . Coenzyme Q10 200 MG capsule Take 200 mg by mouth daily.      . hydrochlorothiazide (HYDRODIURIL) 25 MG tablet Take 1 tablet by mouth every other day.       . Omega 3-6-9 Fatty Acids (TRIPLE OMEGA-3-6-9) CAPS Take 2 capsules by mouth daily.      Marland Kitchen omeprazole (PRILOSEC) 40 MG capsule Take 1 capsule (40 mg total) by mouth daily.  90 capsule  0  . sildenafil (VIAGRA) 100 MG tablet Take 1 tablet (100 mg total) by mouth daily as needed for erectile dysfunction.  3 tablet  12  . terbinafine (LAMISIL) 250 MG tablet Take 1 tablet (250 mg total) by mouth daily.  90 tablet  0  . zinc gluconate 50 MG tablet Take 50 mg by mouth daily.      Marland Kitchen aspirin 81 MG tablet Take 81 mg by mouth every other day.        No facility-administered medications prior to visit.    PE: Blood pressure 127/85, pulse 71, temperature 98.6 F (37 C), temperature  source Temporal, resp. rate 18, height 6\' 1"  (1.854 m), weight 217 lb (98.431 kg), SpO2 97.00%. Gen: Alert, well appearing.  Patient is oriented to person, place, time, and situation. Toenails: no thickening or white substance beneath nails.  IMPRESSION AND PLAN:  1) Onychomycosis, resolved s/p lamisil treatment.  2) Erectile dysfunction: +response to viagra 50-100mg .  Rx for this given today.  FOLLOW UP: prn

## 2013-02-18 NOTE — Patient Instructions (Signed)
Buy OTC diaper cream called boudreaux's Butt Paste and apply liberally twice per day to affected areas. Air-out the region as much as possible.

## 2013-04-30 ENCOUNTER — Other Ambulatory Visit: Payer: Self-pay

## 2013-05-01 ENCOUNTER — Other Ambulatory Visit: Payer: Self-pay | Admitting: Family Medicine

## 2013-05-01 MED ORDER — OMEPRAZOLE 40 MG PO CPDR: 40.0000 mg | DELAYED_RELEASE_CAPSULE | Freq: Every day | ORAL | Status: DC

## 2013-05-12 ENCOUNTER — Other Ambulatory Visit: Payer: Managed Care, Other (non HMO)

## 2013-05-18 ENCOUNTER — Encounter: Payer: Managed Care, Other (non HMO) | Admitting: Family Medicine

## 2013-06-11 ENCOUNTER — Other Ambulatory Visit (INDEPENDENT_AMBULATORY_CARE_PROVIDER_SITE_OTHER): Payer: Managed Care, Other (non HMO)

## 2013-06-11 DIAGNOSIS — Z0000 Encounter for general adult medical examination without abnormal findings: Secondary | ICD-10-CM

## 2013-06-11 LAB — COMPREHENSIVE METABOLIC PANEL
ALT: 40 U/L (ref 0–53)
AST: 26 U/L (ref 0–37)
Alkaline Phosphatase: 77 U/L (ref 39–117)
BUN: 16 mg/dL (ref 6–23)
CO2: 28 mEq/L (ref 19–32)
Calcium: 9.4 mg/dL (ref 8.4–10.5)
Chloride: 105 mEq/L (ref 96–112)
Creatinine, Ser: 0.8 mg/dL (ref 0.4–1.5)
Total Bilirubin: 0.5 mg/dL (ref 0.3–1.2)

## 2013-06-11 LAB — CBC WITH DIFFERENTIAL/PLATELET
Basophils Absolute: 0.1 10*3/uL (ref 0.0–0.1)
Basophils Relative: 0.7 % (ref 0.0–3.0)
Eosinophils Absolute: 0.2 10*3/uL (ref 0.0–0.7)
Hemoglobin: 16.5 g/dL (ref 13.0–17.0)
Lymphocytes Relative: 25.2 % (ref 12.0–46.0)
MCHC: 33.8 g/dL (ref 30.0–36.0)
MCV: 85.4 fl (ref 78.0–100.0)
Monocytes Absolute: 0.7 10*3/uL (ref 0.1–1.0)
Monocytes Relative: 8.7 % (ref 3.0–12.0)
Neutro Abs: 4.9 10*3/uL (ref 1.4–7.7)
Neutrophils Relative %: 62.3 % (ref 43.0–77.0)
RBC: 5.73 Mil/uL (ref 4.22–5.81)
RDW: 14.7 % — ABNORMAL HIGH (ref 11.5–14.6)

## 2013-06-11 LAB — LIPID PANEL
Cholesterol: 169 mg/dL (ref 0–200)
HDL: 30.1 mg/dL — ABNORMAL LOW (ref >39.00)
LDL Cholesterol: 119 mg/dL — ABNORMAL HIGH (ref 0–99)
Total CHOL/HDL Ratio: 6
Triglycerides: 101 mg/dL (ref 0.0–149.0)
VLDL: 20.2 mg/dL (ref 0.0–40.0)

## 2013-06-11 LAB — TSH: TSH: 0.86 u[IU]/mL (ref 0.35–5.50)

## 2013-06-19 ENCOUNTER — Encounter: Payer: Self-pay | Admitting: Family Medicine

## 2013-06-19 ENCOUNTER — Ambulatory Visit (INDEPENDENT_AMBULATORY_CARE_PROVIDER_SITE_OTHER): Payer: Managed Care, Other (non HMO) | Admitting: Family Medicine

## 2013-06-19 VITALS — BP 129/85 | HR 84 | Temp 98.2°F | Resp 18 | Ht 73.0 in | Wt 215.0 lb

## 2013-06-19 DIAGNOSIS — E782 Mixed hyperlipidemia: Secondary | ICD-10-CM

## 2013-06-19 DIAGNOSIS — R7301 Impaired fasting glucose: Secondary | ICD-10-CM

## 2013-06-19 DIAGNOSIS — Z0000 Encounter for general adult medical examination without abnormal findings: Secondary | ICD-10-CM

## 2013-06-19 DIAGNOSIS — Z23 Encounter for immunization: Secondary | ICD-10-CM

## 2013-06-19 NOTE — Assessment & Plan Note (Addendum)
Reviewed age and gender appropriate health maintenance issues (prudent diet, regular exercise, health risks of tobacco and excessive alcohol, use of seatbelts, fire alarms in home, use of sunscreen).  Also reviewed age and gender appropriate health screening as well as vaccine recommendations. Flu and Tdap given IM today.   Recent glucose--not technically fasting--was 130, and HDL low/LDL high but not by a major amount. We discussed approach to this today and agreed TLC was certainly in order but meds for further labs at this point in time were not needed. Will refer to nutritionist.  Encouraged exercise.  Encouraged complete smoking cessation (he is currently using e-cigs as a crutch but is also continuing to smoke cigs). F/u in 3 mo for office recheck and we'll do fasting glucose and lipids at that time.

## 2013-06-19 NOTE — Progress Notes (Signed)
Office Note 06/19/2013  CC:  Chief Complaint  Patient presents with  . Annual Exam    HPI:  Richard Winters is a 47 y.o. White male who is here today for annual CPE. Reviewed recent health panel in detail with pt today.  He admits he had some coffee with sugar and creamer in it before having his blood drawn.  He is fairly active during his work day but otherwise does no exercise. He is not restricting his diet at all but says he plans on starting to limit all fast food to "subway only".   Past Medical History  Diagnosis Date  . HTN (hypertension)   . Chest pain   . Dyslipidemia   . Tobacco dependence   . GERD (gastroesophageal reflux disease)     s/p EGD 02/2010-erosive esophagitis, no barrett's, H. pylori neg--responded well to PPI  . Nephrolithiasis 12/2009    with hypercalcuria (treated w/HCTZ)  . Hiatal hernia   . Erosive esophagitis   . Abdominal lymphadenopathy     CT f/u 52mo later (06/2012) all was stable: interpreted as reactive/post-infectious-no further f/u indicated.    Past Surgical History  Procedure Laterality Date  . Back surgery  age 24 yrs    Lumbar -2 levels (no hardware)--no signif chronic back problems since  . Esophagogastroduodenoscopy  2011    Esophagitis, H. pylori neg, no Barrett's.    Family History  Problem Relation Age of Onset  . Heart attack Father   . Hypertension Father   . Diabetes Maternal Grandmother   . Diabetes Maternal Grandfather     History   Social History  . Marital Status: Married    Spouse Name: N/A    Number of Children: N/A  . Years of Education: N/A   Occupational History  . Not on file.   Social History Main Topics  . Smoking status: Current Every Day Smoker  . Smokeless tobacco: Never Used  . Alcohol Use: Yes  . Drug Use: No  . Sexual Activity: Not on file   Other Topics Concern  . Not on file   Social History Narrative   Married, 1 son and 1 daughter.   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   +cigarettes: 30 pack-yr hx.   Alcohol: occasional beer, rarely whisky.   No drugs.  Occ rides 4 wheelers.   Orig from [place] and went to [employer].    Outpatient Prescriptions Prior to Visit  Medication Sig Dispense Refill  . B Complex-C (B-COMPLEX WITH VITAMIN C) tablet Take 1 tablet by mouth daily.      . Coenzyme Q10 200 MG capsule Take 200 mg by mouth daily.      . hydrochlorothiazide (HYDRODIURIL) 25 MG tablet Take 1 tablet by mouth every other day.       . Omega 3-6-9 Fatty Acids (TRIPLE OMEGA-3-6-9) CAPS Take 2 capsules by mouth daily.      Marland Kitchen omeprazole (PRILOSEC) 40 MG capsule Take 1 capsule (40 mg total) by mouth daily.  90 capsule  1  . sildenafil (VIAGRA) 100 MG tablet Take 1 tablet (100 mg total) by mouth daily as needed for erectile dysfunction.  3 tablet  12  . sildenafil (VIAGRA) 50 MG tablet Take 1 tablet (50 mg total) by mouth daily as needed for erectile dysfunction.  6 tablet  6  . zinc gluconate 50 MG tablet Take 50 mg by mouth daily.      Marland Kitchen aspirin 81 MG tablet Take 81  mg by mouth every other day.       . terbinafine (LAMISIL) 250 MG tablet Take 1 tablet (250 mg total) by mouth daily.  90 tablet  0   No facility-administered medications prior to visit.    No Known Allergies  ROS Review of Systems  Constitutional: Negative for fever, chills, appetite change and fatigue.  HENT: Negative for congestion, dental problem, ear pain and sore throat.   Eyes: Negative for discharge, redness and visual disturbance.  Respiratory: Negative for cough, chest tightness, shortness of breath and wheezing.   Cardiovascular: Negative for chest pain, palpitations and leg swelling.  Gastrointestinal: Negative for nausea, vomiting, abdominal pain, diarrhea and blood in stool.  Genitourinary: Negative for dysuria, urgency, frequency, hematuria, flank pain and difficulty urinating.  Musculoskeletal: Negative for arthralgias, back pain, joint swelling, myalgias and  neck stiffness.  Skin: Negative for pallor and rash.  Neurological: Negative for dizziness, speech difficulty, weakness and headaches.  Hematological: Negative for adenopathy. Does not bruise/bleed easily.  Psychiatric/Behavioral: Negative for confusion and sleep disturbance. The patient is not nervous/anxious.      PE; Blood pressure 129/85, pulse 84, temperature 98.2 F (36.8 C), temperature source Temporal, resp. rate 18, height 6\' 1"  (1.854 m), weight 215 lb (97.523 kg), SpO2 99.00%. Gen: Alert, well appearing.  Patient is oriented to person, place, time, and situation. AFFECT: pleasant, lucid thought and speech. ENT: Ears: EACs clear, normal epithelium.  TMs with good light reflex and landmarks bilaterally.  Eyes: no injection, icteris, swelling, or exudate.  EOMI, PERRLA. Nose: no drainage or turbinate edema/swelling.  No injection or focal lesion.  Mouth: lips without lesion/swelling.  Oral mucosa pink and moist.  Dentition intact and without obvious caries or gingival swelling.  Oropharynx without erythema, exudate, or swelling.  Neck: supple/nontender.  No LAD, mass, or TM.  Carotid pulses 2+ bilaterally, without bruits. CV: RRR, no m/r/g.   LUNGS: CTA bilat, nonlabored resps, good aeration in all lung fields. ABD: soft, NT, ND, BS normal.  No hepatospenomegaly or mass.  No bruits. EXT: no clubbing, cyanosis, or edema.  Musculoskeletal: no joint swelling, erythema, warmth, or tenderness.  ROM of all joints intact. Skin - no sores or suspicious lesions or rashes or color changes Rectal: deferred GU: deferred  Pertinent labs:  Lab Results  Component Value Date   TSH 0.86 06/11/2013   Lab Results  Component Value Date   WBC 7.9 06/11/2013   HGB 16.5 06/11/2013   HCT 49.0 06/11/2013   MCV 85.4 06/11/2013   PLT 246.0 06/11/2013   Lab Results  Component Value Date   CREATININE 0.8 06/11/2013   BUN 16 06/11/2013   NA 140 06/11/2013   K 4.3 06/11/2013   CL 105 06/11/2013    CO2 28 06/11/2013   Lab Results  Component Value Date   ALT 40 06/11/2013   AST 26 06/11/2013   ALKPHOS 77 06/11/2013   BILITOT 0.5 06/11/2013   Lab Results  Component Value Date   CHOL 169 06/11/2013   Lab Results  Component Value Date   HDL 30.10* 06/11/2013   Lab Results  Component Value Date   LDLCALC 119* 06/11/2013   Lab Results  Component Value Date   TRIG 101.0 06/11/2013   Lab Results  Component Value Date   CHOLHDL 6 06/11/2013   Lab Results  Component Value Date   PSA 0.33 01/25/2012       ASSESSMENT AND PLAN:   Health maintenance examination Reviewed age and gender  appropriate health maintenance issues (prudent diet, regular exercise, health risks of tobacco and excessive alcohol, use of seatbelts, fire alarms in home, use of sunscreen).  Also reviewed age and gender appropriate health screening as well as vaccine recommendations. Flu and Tdap given IM today.   Recent glucose--not technically fasting--was 130, and HDL low/LDL high but not by a major amount. We discussed approach to this today and agreed TLC was certainly in order but meds for further labs at this point in time were not needed. Will refer to nutritionist.  Encouraged exercise.  Encouraged complete smoking cessation (he is currently using e-cigs as a crutch but is also continuing to smoke cigs). F/u in 3 mo for office recheck and we'll do fasting glucose and lipids at that time.  An After Visit Summary was printed and given to the patient.   FOLLOW UP:  Return in about 3 months (around 09/17/2013) for fasting, for o/v and labs.

## 2013-06-19 NOTE — Progress Notes (Signed)
Pre visit review using our clinic review tool, if applicable. No additional management support is needed unless otherwise documented below in the visit note. 

## 2013-07-05 ENCOUNTER — Encounter: Payer: Self-pay | Admitting: Family Medicine

## 2013-07-06 MED ORDER — ALCLOMETASONE DIPROPIONATE 0.05 % EX OINT: TOPICAL_OINTMENT | CUTANEOUS | Status: DC

## 2013-07-06 NOTE — Telephone Encounter (Signed)
Pls notify pt that I have sent a rx for a medicated ointment to his pharmacy to see if it takes care of his rash better.-thx

## 2013-07-07 ENCOUNTER — Encounter: Payer: Self-pay | Admitting: Family Medicine

## 2013-07-28 ENCOUNTER — Telehealth: Payer: Self-pay | Admitting: Family Medicine

## 2013-07-28 NOTE — Telephone Encounter (Signed)
Relevant patient education assigned to patient using Emmi. ° °

## 2013-08-10 ENCOUNTER — Encounter: Payer: Self-pay | Admitting: Family Medicine

## 2013-08-11 ENCOUNTER — Other Ambulatory Visit: Payer: Self-pay | Admitting: Family Medicine

## 2013-08-11 MED ORDER — HYDROCHLOROTHIAZIDE 25 MG PO TABS: 25.0000 mg | ORAL_TABLET | ORAL | Status: DC

## 2013-09-17 ENCOUNTER — Other Ambulatory Visit (INDEPENDENT_AMBULATORY_CARE_PROVIDER_SITE_OTHER): Payer: BC Managed Care – PPO

## 2013-09-17 DIAGNOSIS — R7309 Other abnormal glucose: Secondary | ICD-10-CM

## 2013-09-17 DIAGNOSIS — R739 Hyperglycemia, unspecified: Secondary | ICD-10-CM

## 2013-09-17 LAB — LIPID PANEL
CHOL/HDL RATIO: 5
CHOLESTEROL: 159 mg/dL (ref 0–200)
HDL: 30.7 mg/dL — AB (ref >39.00)
LDL Cholesterol: 108 mg/dL — ABNORMAL HIGH (ref 0–99)
TRIGLYCERIDES: 102 mg/dL (ref 0.0–149.0)
VLDL: 20.4 mg/dL (ref 0.0–40.0)

## 2013-09-17 LAB — BASIC METABOLIC PANEL
BUN: 16 mg/dL (ref 6–23)
CO2: 25 meq/L (ref 19–32)
Calcium: 9.2 mg/dL (ref 8.4–10.5)
Chloride: 107 mEq/L (ref 96–112)
Creatinine, Ser: 0.9 mg/dL (ref 0.4–1.5)
GFR: 91.44 mL/min (ref >60.00)
GLUCOSE: 92 mg/dL (ref 70–99)
POTASSIUM: 4.3 meq/L (ref 3.5–5.1)
SODIUM: 140 meq/L (ref 135–145)

## 2013-09-22 ENCOUNTER — Encounter: Payer: Self-pay | Admitting: Family Medicine

## 2013-09-22 ENCOUNTER — Ambulatory Visit (INDEPENDENT_AMBULATORY_CARE_PROVIDER_SITE_OTHER): Payer: BC Managed Care – PPO | Admitting: Family Medicine

## 2013-09-22 ENCOUNTER — Telehealth: Payer: Self-pay | Admitting: Family Medicine

## 2013-09-22 VITALS — BP 125/84 | HR 66 | Temp 98.1°F | Resp 18 | Ht 73.0 in | Wt 218.0 lb

## 2013-09-22 DIAGNOSIS — E786 Lipoprotein deficiency: Secondary | ICD-10-CM

## 2013-09-22 DIAGNOSIS — F17200 Nicotine dependence, unspecified, uncomplicated: Secondary | ICD-10-CM

## 2013-09-22 DIAGNOSIS — I10 Essential (primary) hypertension: Secondary | ICD-10-CM

## 2013-09-22 NOTE — Progress Notes (Signed)
OFFICE NOTE  09/22/2013  CC:  Chief Complaint  Patient presents with  . Follow-up    3 months   HPI: Patient is a 48 y.o. Caucasian male who is here for 3 mo f/u HTN, review fasting BMET and Lipids done last week.  Glucose was normal, LDL improved some, HDL still the same (low). Still smoking the same, diet largely the same although he is trying to be more "aware" of what he is eating/habits, etc. Compliant with bp med daily.   Pertinent PMH:  Past medical, surgical, social, and family history reviewed and no changes are noted since last office visit.  MEDS: No longer taking lamisil listed below Outpatient Prescriptions Prior to Visit  Medication Sig Dispense Refill  . aspirin 81 MG tablet Take 81 mg by mouth every other day.       . B Complex-C (B-COMPLEX WITH VITAMIN C) tablet Take 1 tablet by mouth daily.      . Coenzyme Q10 200 MG capsule Take 200 mg by mouth daily.      . hydrochlorothiazide (HYDRODIURIL) 25 MG tablet Take 1 tablet (25 mg total) by mouth every other day.  15 tablet  6  . Omega 3-6-9 Fatty Acids (TRIPLE OMEGA-3-6-9) CAPS Take 2 capsules by mouth daily.      Marland Kitchen omeprazole (PRILOSEC) 40 MG capsule Take 1 capsule (40 mg total) by mouth daily.  90 capsule  1  . sildenafil (VIAGRA) 100 MG tablet Take 1 tablet (100 mg total) by mouth daily as needed for erectile dysfunction.  3 tablet  12  . sildenafil (VIAGRA) 50 MG tablet Take 1 tablet (50 mg total) by mouth daily as needed for erectile dysfunction.  6 tablet  6  . zinc gluconate 50 MG tablet Take 50 mg by mouth daily.      Marland Kitchen alclomethasone (ACLOVATE) 0.05 % ointment Apply bid prn to areas of inflammation and itching on your bottom  60 g  1  . terbinafine (LAMISIL) 250 MG tablet Take 1 tablet (250 mg total) by mouth daily.  90 tablet  0   No facility-administered medications prior to visit.    PE: Blood pressure 125/84, pulse 66, temperature 98.1 F (36.7 C), temperature source Oral, resp. rate 18, height 6\' 1"   (1.854 m), weight 218 lb (98.884 kg), SpO2 97.00%. Gen: Alert, well appearing.  Patient is oriented to person, place, time, and situation. No further exam today.  IMPRESSION AND PLAN:  1) HTN; The current medical regimen is effective;  continue present plan and medications. He likely technically fits into category of metabolic syndrome (HDL low, waist circ, bp). Reiterated the importance of TLC in this kind of situation.  No additional meds at this time. Encouraged him to continue with measures to cut back on smoking.  FOLLOW UP: 9 mo CPE with fasting health panel the week prior.

## 2013-09-22 NOTE — Telephone Encounter (Signed)
Relevant patient education assigned to patient using Emmi. ° °

## 2013-09-23 ENCOUNTER — Telehealth: Payer: Self-pay | Admitting: Family Medicine

## 2013-09-23 NOTE — Telephone Encounter (Signed)
Relevant patient education assigned to patient using Emmi. ° °

## 2013-10-15 ENCOUNTER — Other Ambulatory Visit: Payer: Self-pay | Admitting: Family Medicine

## 2013-11-06 ENCOUNTER — Encounter: Payer: Self-pay | Admitting: Family Medicine

## 2013-11-10 MED ORDER — SILDENAFIL CITRATE 20 MG PO TABS: ORAL_TABLET | ORAL | Status: DC

## 2013-11-10 NOTE — Telephone Encounter (Signed)
Richard Winters, I printed this rx for 20mg  viagra tabs--pls fax to [facility] in [place] as per pt's request.

## 2013-11-10 NOTE — Telephone Encounter (Signed)
Rx faxed to [facility].  

## 2014-04-12 ENCOUNTER — Other Ambulatory Visit: Payer: Self-pay | Admitting: Family Medicine

## 2014-04-12 MED ORDER — OMEPRAZOLE 40 MG PO CPDR: DELAYED_RELEASE_CAPSULE | ORAL | Status: DC

## 2014-04-13 ENCOUNTER — Other Ambulatory Visit: Payer: Self-pay | Admitting: Family Medicine

## 2014-04-13 MED ORDER — HYDROCHLOROTHIAZIDE 25 MG PO TABS: 25.0000 mg | ORAL_TABLET | ORAL | Status: DC

## 2014-06-17 ENCOUNTER — Other Ambulatory Visit (INDEPENDENT_AMBULATORY_CARE_PROVIDER_SITE_OTHER): Payer: BC Managed Care – PPO

## 2014-06-17 DIAGNOSIS — Z0000 Encounter for general adult medical examination without abnormal findings: Secondary | ICD-10-CM

## 2014-06-17 LAB — COMPREHENSIVE METABOLIC PANEL
ALBUMIN: 3.9 g/dL (ref 3.5–5.2)
ALT: 30 U/L (ref 0–53)
AST: 21 U/L (ref 0–37)
Alkaline Phosphatase: 75 U/L (ref 39–117)
BILIRUBIN TOTAL: 1.1 mg/dL (ref 0.2–1.2)
BUN: 14 mg/dL (ref 6–23)
CALCIUM: 9.3 mg/dL (ref 8.4–10.5)
CO2: 26 meq/L (ref 19–32)
CREATININE: 0.9 mg/dL (ref 0.4–1.5)
Chloride: 104 mEq/L (ref 96–112)
GFR: 101 mL/min (ref >60.00)
GLUCOSE: 99 mg/dL (ref 70–99)
Potassium: 5 mEq/L (ref 3.5–5.1)
SODIUM: 139 meq/L (ref 135–145)
Total Protein: 7.1 g/dL (ref 6.0–8.3)

## 2014-06-17 LAB — CBC WITH DIFFERENTIAL/PLATELET
Basophils Absolute: 0.1 10*3/uL (ref 0.0–0.1)
Basophils Relative: 0.7 % (ref 0.0–3.0)
EOS ABS: 0.3 10*3/uL (ref 0.0–0.7)
Eosinophils Relative: 3.9 % (ref 0.0–5.0)
HEMATOCRIT: 50.1 % (ref 39.0–52.0)
HEMOGLOBIN: 16.7 g/dL (ref 13.0–17.0)
Lymphocytes Relative: 30.8 % (ref 12.0–46.0)
Lymphs Abs: 2.4 10*3/uL (ref 0.7–4.0)
MCHC: 33.3 g/dL (ref 30.0–36.0)
MCV: 87.4 fl (ref 78.0–100.0)
Monocytes Absolute: 0.8 10*3/uL (ref 0.1–1.0)
Monocytes Relative: 9.7 % (ref 3.0–12.0)
NEUTROS ABS: 4.3 10*3/uL (ref 1.4–7.7)
Neutrophils Relative %: 54.9 % (ref 43.0–77.0)
Platelets: 253 10*3/uL (ref 150.0–400.0)
RBC: 5.73 Mil/uL (ref 4.22–5.81)
RDW: 14.6 % (ref 11.5–15.5)
WBC: 7.8 10*3/uL (ref 4.0–10.5)

## 2014-06-17 LAB — LIPID PANEL
Cholesterol: 177 mg/dL (ref 0–200)
HDL: 31 mg/dL — ABNORMAL LOW (ref >39.00)
LDL Cholesterol: 120 mg/dL — ABNORMAL HIGH (ref 0–99)
NONHDL: 146
Total CHOL/HDL Ratio: 6
Triglycerides: 129 mg/dL (ref 0.0–149.0)
VLDL: 25.8 mg/dL (ref 0.0–40.0)

## 2014-06-17 LAB — TSH: TSH: 1.18 u[IU]/mL (ref 0.35–4.50)

## 2014-06-24 ENCOUNTER — Encounter: Payer: BC Managed Care – PPO | Admitting: Family Medicine

## 2014-07-02 ENCOUNTER — Ambulatory Visit (INDEPENDENT_AMBULATORY_CARE_PROVIDER_SITE_OTHER): Payer: BLUE CROSS/BLUE SHIELD | Admitting: Family Medicine

## 2014-07-02 ENCOUNTER — Encounter: Payer: Self-pay | Admitting: Family Medicine

## 2014-07-02 VITALS — BP 144/91 | HR 67 | Temp 98.2°F | Resp 18 | Ht 73.0 in | Wt 223.0 lb

## 2014-07-02 DIAGNOSIS — Z0000 Encounter for general adult medical examination without abnormal findings: Secondary | ICD-10-CM

## 2014-07-02 DIAGNOSIS — Z23 Encounter for immunization: Secondary | ICD-10-CM

## 2014-07-02 NOTE — Progress Notes (Signed)
Office Note 07/02/2014  CC:  Chief Complaint  Patient presents with  . Annual Exam    HPI:  Richard Winters is a 49 y.o. White male who is here for his annual CPE. We reviewed his recent fasting labs in detail today.   He and his wife are getting ready to switch to vap cigs soon.    Says he feels like his ears ring some, question of decreased hearing. Has hx of lots of wax in ears before.    Past Medical History  Diagnosis Date  . HTN (hypertension)   . Chest pain   . Dyslipidemia   . Tobacco dependence   . GERD (gastroesophageal reflux disease)     s/p EGD 02/2010-erosive esophagitis, no barrett's, H. pylori neg--responded well to PPI  . Nephrolithiasis 12/2009    with hypercalcuria (treated w/HCTZ)  . Hiatal hernia   . Erosive esophagitis   . Abdominal lymphadenopathy     CT f/u 61mo later (06/2012) all was stable: interpreted as reactive/post-infectious-no further f/u indicated.    Past Surgical History  Procedure Laterality Date  . Back surgery  age 12 yrs    Lumbar -2 levels (no hardware)--no signif chronic back problems since  . Esophagogastroduodenoscopy  2011    Esophagitis, H. pylori neg, no Barrett's.    Family History  Problem Relation Age of Onset  . Heart attack Father   . Hypertension Father   . Diabetes Maternal Grandmother   . Diabetes Maternal Grandfather     History   Social History  . Marital Status: Married    Spouse Name: N/A    Number of Children: N/A  . Years of Education: N/A   Occupational History  . Not on file.   Social History Main Topics  . Smoking status: Current Every Day Smoker  . Smokeless tobacco: Never Used  . Alcohol Use: Yes  . Drug Use: No  . Sexual Activity: Not on file   Other Topics Concern  . Not on file   Social History Narrative   Married, 1 son and 1 daughter.   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   +cigarettes: 30 pack-yr hx.   Alcohol: occasional beer, rarely whisky.   No  drugs.  Occ rides 4 wheelers.   Orig from [place] and went to [employer].    Outpatient Prescriptions Prior to Visit  Medication Sig Dispense Refill  . B Complex-C (B-COMPLEX WITH VITAMIN C) tablet Take 1 tablet by mouth daily.    . hydrochlorothiazide (HYDRODIURIL) 25 MG tablet Take 1 tablet (25 mg total) by mouth every other day. 15 tablet 3  . Omega 3-6-9 Fatty Acids (TRIPLE OMEGA-3-6-9) CAPS Take 2 capsules by mouth daily.    Marland Kitchen omeprazole (PRILOSEC) 40 MG capsule TAKE 1 CAPSULE (40 MG TOTAL) BY MOUTH DAILY. 90 capsule 1  . zinc gluconate 50 MG tablet Take 50 mg by mouth daily.    Marland Kitchen alclomethasone (ACLOVATE) 0.05 % ointment Apply bid prn to areas of inflammation and itching on your bottom (Patient not taking: Reported on 07/02/2014) 60 g 1  . aspirin 81 MG tablet Take 81 mg by mouth every other day.     . Coenzyme Q10 200 MG capsule Take 200 mg by mouth daily.    . sildenafil (REVATIO) 20 MG tablet 2-5 tabs po qd prn 30 min prior to sexual activity 50 tablet 3  . sildenafil (VIAGRA) 100 MG tablet Take 1 tablet (100 mg total) by mouth daily as  needed for erectile dysfunction. 3 tablet 12  . sildenafil (VIAGRA) 50 MG tablet Take 1 tablet (50 mg total) by mouth daily as needed for erectile dysfunction. 6 tablet 6  . terbinafine (LAMISIL) 250 MG tablet Take 1 tablet (250 mg total) by mouth daily. 90 tablet 0   No facility-administered medications prior to visit.    No Known Allergies  ROS Review of Systems  Constitutional: Negative for fever, chills, appetite change and fatigue.  HENT: Negative for congestion, dental problem, ear pain and sore throat.   Eyes: Negative for discharge, redness and visual disturbance.  Respiratory: Negative for cough, chest tightness, shortness of breath and wheezing.   Cardiovascular: Negative for chest pain, palpitations and leg swelling.  Gastrointestinal: Negative for nausea, vomiting, abdominal pain, diarrhea and blood in stool.  Genitourinary: Negative  for dysuria, urgency, frequency, hematuria, flank pain and difficulty urinating.  Musculoskeletal: Negative for myalgias, back pain, joint swelling, arthralgias and neck stiffness.  Skin: Negative for pallor and rash.  Neurological: Negative for dizziness, speech difficulty, weakness and headaches.  Hematological: Negative for adenopathy. Does not bruise/bleed easily.  Psychiatric/Behavioral: Negative for confusion and sleep disturbance. The patient is not nervous/anxious.     PE; Blood pressure 144/91, pulse 67, temperature 98.2 F (36.8 C), temperature source Temporal, resp. rate 18, height 6\' 1"  (1.854 m), weight 223 lb (101.152 kg), SpO2 97 %. Gen: Alert, well appearing.  Patient is oriented to person, place, time, and situation. AFFECT: pleasant, lucid thought and speech. ENT: Ears: EACs clear, normal epithelium.  TMs with good light reflex and landmarks bilaterally.  Eyes: no injection, icteris, swelling, or exudate.  EOMI, PERRLA. Nose: no drainage or turbinate edema/swelling.  No injection or focal lesion.  Mouth: lips without lesion/swelling.  Oral mucosa pink and moist.  Dentition intact and without obvious caries or gingival swelling.  Oropharynx without erythema, exudate, or swelling.  Neck: supple/nontender.  No LAD, mass, or TM.  Carotid pulses 2+ bilaterally, without bruits. CV: RRR, no m/r/g.   LUNGS: CTA bilat, nonlabored resps, good aeration in all lung fields. ABD: soft, NT, ND, BS normal.  No hepatospenomegaly or mass.  No bruits. EXT: no clubbing, cyanosis, or edema.  Musculoskeletal: no joint swelling, erythema, warmth, or tenderness.  ROM of all joints intact. Skin - no sores or suspicious lesions or rashes or color changes   Pertinent labs:  Lab Results  Component Value Date   TSH 1.18 06/17/2014   Lab Results  Component Value Date   WBC 7.8 06/17/2014   HGB 16.7 06/17/2014   HCT 50.1 06/17/2014   MCV 87.4 06/17/2014   PLT 253.0 06/17/2014   Lab Results   Component Value Date   CREATININE 0.9 06/17/2014   BUN 14 06/17/2014   NA 139 06/17/2014   K 5.0 06/17/2014   CL 104 06/17/2014   CO2 26 06/17/2014   Lab Results  Component Value Date   ALT 30 06/17/2014   AST 21 06/17/2014   ALKPHOS 75 06/17/2014   BILITOT 1.1 06/17/2014   Lab Results  Component Value Date   CHOL 177 06/17/2014   Lab Results  Component Value Date   HDL 31.00* 06/17/2014   Lab Results  Component Value Date   LDLCALC 120* 06/17/2014   Lab Results  Component Value Date   TRIG 129.0 06/17/2014   Lab Results  Component Value Date   CHOLHDL 6 06/17/2014   Lab Results  Component Value Date   PSA 0.33 01/25/2012  ASSESSMENT AND PLAN:   Health maintenance examination Reviewed age and gender appropriate health maintenance issues (prudent diet, regular exercise, health risks of tobacco and excessive alcohol, use of seatbelts, fire alarms in home, use of sunscreen).  Also reviewed age and gender appropriate health screening as well as vaccine recommendations. Flu vaccine IM today. Recommended statin but pt wants to think about it and discuss with wife, also wants to think about dietitian referral at this time regarding his hyperlipidemia.    An After Visit Summary was printed and given to the patient.  FOLLOW UP:  Return in about 6 months (around 12/31/2014) for routine chronic illness f/u.

## 2014-07-02 NOTE — Assessment & Plan Note (Signed)
Reviewed age and gender appropriate health maintenance issues (prudent diet, regular exercise, health risks of tobacco and excessive alcohol, use of seatbelts, fire alarms in home, use of sunscreen).  Also reviewed age and gender appropriate health screening as well as vaccine recommendations. Flu vaccine IM today. Recommended statin but pt wants to think about it and discuss with wife, also wants to think about dietitian referral at this time regarding his hyperlipidemia.

## 2014-07-02 NOTE — Progress Notes (Signed)
Pre visit review using our clinic review tool, if applicable. No additional management support is needed unless otherwise documented below in the visit note. 

## 2014-07-23 ENCOUNTER — Telehealth: Payer: Self-pay | Admitting: Family Medicine

## 2014-07-23 NOTE — Telephone Encounter (Signed)
Spoke to patient's wife.  Pt needs to go UC.

## 2014-07-23 NOTE — Telephone Encounter (Signed)
Due to pts. Work schedule he can not get here when we are open. Has not had a voice for a week otherwise feels fine. Would like someone to call his wife and see if there is any medication that he can take to help this. Please call her at [phone].

## 2014-08-11 ENCOUNTER — Encounter: Payer: Self-pay | Admitting: Family Medicine

## 2014-08-11 ENCOUNTER — Ambulatory Visit (INDEPENDENT_AMBULATORY_CARE_PROVIDER_SITE_OTHER): Payer: BLUE CROSS/BLUE SHIELD | Admitting: Family Medicine

## 2014-08-11 VITALS — BP 130/87 | HR 61 | Temp 98.2°F | Resp 18 | Ht 73.0 in | Wt 220.0 lb

## 2014-08-11 DIAGNOSIS — J040 Acute laryngitis: Secondary | ICD-10-CM

## 2014-08-11 MED ORDER — PREDNISONE 20 MG PO TABS: ORAL_TABLET | ORAL | Status: DC

## 2014-08-11 NOTE — Progress Notes (Signed)
Pre visit review using our clinic review tool, if applicable. No additional management support is needed unless otherwise documented below in the visit note. 

## 2014-08-11 NOTE — Progress Notes (Signed)
OFFICE NOTE  08/11/2014  CC:  Chief Complaint  Patient presents with  . Laryngitis     HPI: Patient is a 49 y.o. Caucasian male who is here for complaint of loss of voice since a resp illness (URI and coughing) he had at end of November. Describes voice "going in and out" in the month afterwards and then lost voice completely for about a week about 3 wks ago. Has mucous in back of throat chronically but says this has been worse as of late.  No signif cough, but clears throat a lot. No fevers.  He feels well/normal.  Coralee Rud omepra 40mg  qd and denies feeling any typical GERD sx's. Voice is the worst in morning upon awakening, mouth/throat feel dry.  Still smoking cigarettes and just a small amount of vaps.   Pertinent PMH:  Past medical, surgical, social, and family history reviewed and no changes are noted since last office visit.  MEDS:  Outpatient Prescriptions Prior to Visit  Medication Sig Dispense Refill  . alclomethasone (ACLOVATE) 0.05 % ointment Apply bid prn to areas of inflammation and itching on your bottom 60 g 1  . B Complex-C (B-COMPLEX WITH VITAMIN C) tablet Take 1 tablet by mouth daily.    . Coenzyme Q10 200 MG capsule Take 200 mg by mouth daily.    . hydrochlorothiazide (HYDRODIURIL) 25 MG tablet Take 1 tablet (25 mg total) by mouth every other day. 15 tablet 3  . Omega 3-6-9 Fatty Acids (TRIPLE OMEGA-3-6-9) CAPS Take 2 capsules by mouth daily.    Marland Kitchen omeprazole (PRILOSEC) 40 MG capsule TAKE 1 CAPSULE (40 MG TOTAL) BY MOUTH DAILY. 90 capsule 1  . terbinafine (LAMISIL) 250 MG tablet Take 1 tablet (250 mg total) by mouth daily. 90 tablet 0  . zinc gluconate 50 MG tablet Take 50 mg by mouth daily.    . sildenafil (REVATIO) 20 MG tablet 2-5 tabs po qd prn 30 min prior to sexual activity 50 tablet 3  . sildenafil (VIAGRA) 100 MG tablet Take 1 tablet (100 mg total) by mouth daily as needed for erectile dysfunction. 3 tablet 12  . sildenafil (VIAGRA) 50 MG tablet Take 1  tablet (50 mg total) by mouth daily as needed for erectile dysfunction. 6 tablet 6  . aspirin 81 MG tablet Take 81 mg by mouth every other day.      No facility-administered medications prior to visit.   PE: Blood pressure 130/87, pulse 61, temperature 98.2 F (36.8 C), temperature source Temporal, resp. rate 18, height 6\' 1"  (1.854 m), weight 220 lb (99.791 kg), SpO2 98 %. Gen: Alert, well appearing.  Patient is oriented to person, place, time, and situation. ENT: Ears: EACs clear, normal epithelium.  TMs with good light reflex and landmarks bilaterally.  Air bubbles noted in both middle ears with TMs a bit retracted on both sides.  Eyes: no injection, icteris, swelling, or exudate.  EOMI, PERRLA. Nose: no drainage but mild dried mucous and injection of nasal mucosa is noted.  No bleeding or focal lesion.   Dennie Morgan line on nose.  Mouth: lips without lesion/swelling.  Oral mucosa pink and moist.  Dentition intact and without obvious caries or gingival swelling.  Oropharynx with diffuse "beefy" erythema. No exudate or asymmetric swelling.  No paranasal sinus TTP. Neck - No masses or thyromegaly or LAD or limitation in range of motion CV: RRR, no m/r/g.   LUNGS: CTA bilat, nonlabored resps, good aeration in all lung fields.  LAB:  none  IMPRESSION AND PLAN:  Laryngitis, subacute. Multifactorial: post-viral inflammation/edema of glottis, acute worsening of "silent" LPR during the acute resp illness back in November +/- ongoing LPR, ongoing tobacco abuse, lack of sufficient voice rest.  Plan: prednisone 40 x 5, 20 x 5, 10 x 4. Omeprazole 40mg  BID for 2 wks. Saline nasal spray bid. Decrease smoking as much as possible. Humidifier in bedroom. Voice rest emphasized as very important.   Pt expressed understanding of plan and agrees.  An After Visit Summary was printed and given to the patient.  FOLLOW UP: 2 wks: if not improved then will have ENT see him.

## 2014-08-11 NOTE — Patient Instructions (Addendum)
Buy generic over the counter saline nasal spray and use 2 sprays in each nostril TWICE per day. Use a cool mist humidifier in your bedroom. Try to cut back on smoking as much as possible. Rest your voice as much as possible.

## 2014-08-26 ENCOUNTER — Ambulatory Visit: Payer: BLUE CROSS/BLUE SHIELD | Admitting: Family Medicine

## 2014-08-27 ENCOUNTER — Encounter: Payer: Self-pay | Admitting: Family Medicine

## 2014-09-01 MED ORDER — PREDNISONE 20 MG PO TABS: ORAL_TABLET | ORAL | Status: DC

## 2014-09-01 NOTE — Telephone Encounter (Signed)
OK, we'll give another round of prednisone a try. I sent this to your pharmacy just now.  Let me know how things go. --PM

## 2014-10-04 ENCOUNTER — Other Ambulatory Visit: Payer: Self-pay | Admitting: *Deleted

## 2014-10-04 MED ORDER — OMEPRAZOLE 40 MG PO CPDR: DELAYED_RELEASE_CAPSULE | ORAL | Status: DC

## 2014-12-30 ENCOUNTER — Ambulatory Visit (INDEPENDENT_AMBULATORY_CARE_PROVIDER_SITE_OTHER): Payer: BLUE CROSS/BLUE SHIELD | Admitting: Family Medicine

## 2014-12-30 ENCOUNTER — Encounter: Payer: Self-pay | Admitting: Family Medicine

## 2014-12-30 VITALS — BP 134/86 | HR 68 | Temp 98.0°F | Resp 16 | Ht 73.0 in | Wt 216.0 lb

## 2014-12-30 DIAGNOSIS — E785 Hyperlipidemia, unspecified: Secondary | ICD-10-CM

## 2014-12-30 DIAGNOSIS — IMO0001 Reserved for inherently not codable concepts without codable children: Secondary | ICD-10-CM

## 2014-12-30 DIAGNOSIS — K219 Gastro-esophageal reflux disease without esophagitis: Secondary | ICD-10-CM | POA: Diagnosis not present

## 2014-12-30 DIAGNOSIS — F17200 Nicotine dependence, unspecified, uncomplicated: Secondary | ICD-10-CM

## 2014-12-30 DIAGNOSIS — I10 Essential (primary) hypertension: Secondary | ICD-10-CM

## 2014-12-30 DIAGNOSIS — R030 Elevated blood-pressure reading, without diagnosis of hypertension: Secondary | ICD-10-CM

## 2014-12-30 MED ORDER — OMEPRAZOLE 40 MG PO CPDR: DELAYED_RELEASE_CAPSULE | ORAL | Status: DC

## 2014-12-30 NOTE — Progress Notes (Signed)
Pre visit review using our clinic review tool, if applicable. No additional management support is needed unless otherwise documented below in the visit note. 

## 2014-12-30 NOTE — Progress Notes (Signed)
OFFICE NOTE  12/30/2014  CC:  Chief Complaint  Patient presents with  . Follow-up    6 month f/u. Pt is not fasting.     HPI: Patient is a 49 y.o. Caucasian male who is here for 6 mo f/u HTN, GERD, tob dependence. Minimal cutting back on cigs/switch to vap's. Still doesn't want to try statin, wants to try TLC only and recheck lipids next o/v in 6 mo.  No home bp monitoring being done.  Usually takes HCTZ only on the weekends.  GERD stable as long as he takes omeprazole every day.  "I can feel it if I miss it".  Pertinent PMH:  Past medical, surgical, social, and family history reviewed and no changes are noted since last office visit.  MEDS: Taking only omeprazole, hctz, and aclovate listed below. Outpatient Prescriptions Prior to Visit  Medication Sig Dispense Refill  . alclomethasone (ACLOVATE) 0.05 % ointment Apply bid prn to areas of inflammation and itching on your bottom 60 g 1  . hydrochlorothiazide (HYDRODIURIL) 25 MG tablet Take 1 tablet (25 mg total) by mouth every other day. 15 tablet 3  . Omega 3-6-9 Fatty Acids (TRIPLE OMEGA-3-6-9) CAPS Take 2 capsules by mouth daily.    Marland Kitchen omeprazole (PRILOSEC) 40 MG capsule TAKE 1 CAPSULE (40 MG TOTAL) BY MOUTH DAILY. 90 capsule 1  . B Complex-C (B-COMPLEX WITH VITAMIN C) tablet Take 1 tablet by mouth daily.    . Coenzyme Q10 200 MG capsule Take 200 mg by mouth daily.    . sildenafil (REVATIO) 20 MG tablet 2-5 tabs po qd prn 30 min prior to sexual activity 50 tablet 3  . sildenafil (VIAGRA) 100 MG tablet Take 1 tablet (100 mg total) by mouth daily as needed for erectile dysfunction. 3 tablet 12  . sildenafil (VIAGRA) 50 MG tablet Take 1 tablet (50 mg total) by mouth daily as needed for erectile dysfunction. 6 tablet 6  . terbinafine (LAMISIL) 250 MG tablet Take 1 tablet (250 mg total) by mouth daily. 90 tablet 0  . zinc gluconate 50 MG tablet Take 50 mg by mouth daily.    . predniSONE (DELTASONE) 20 MG tablet 2 tabs po qd x 5d, then 1  tab po qd x 5d, then 1/2 tab po qd x 4d (Patient not taking: Reported on 12/30/2014) 17 tablet 0   No facility-administered medications prior to visit.    PE: Blood pressure 134/86, pulse 68, temperature 98 F (36.7 C), temperature source Oral, resp. rate 16, height 6\' 1"  (1.854 m), weight 216 lb (97.977 kg), SpO2 97 %. Gen: Alert, well appearing.  Patient is oriented to person, place, time, and situation.   LABS: Lab Results  Component Value Date   TSH 1.18 06/17/2014   Lab Results  Component Value Date   WBC 7.8 06/17/2014   HGB 16.7 06/17/2014   HCT 50.1 06/17/2014   MCV 87.4 06/17/2014   PLT 253.0 06/17/2014   Lab Results  Component Value Date   CREATININE 0.9 06/17/2014   BUN 14 06/17/2014   NA 139 06/17/2014   K 5.0 06/17/2014   CL 104 06/17/2014   CO2 26 06/17/2014   Lab Results  Component Value Date   ALT 30 06/17/2014   AST 21 06/17/2014   ALKPHOS 75 06/17/2014   BILITOT 1.1 06/17/2014   Lab Results  Component Value Date   CHOL 177 06/17/2014   Lab Results  Component Value Date   HDL 31.00* 06/17/2014   Lab Results  Component Value Date   LDLCALC 120* 06/17/2014   Lab Results  Component Value Date   TRIG 129.0 06/17/2014   Lab Results  Component Value Date   CHOLHDL 6 06/17/2014   Lab Results  Component Value Date   PSA 0.33 01/25/2012    IMPRESSION AND PLAN:  1) HTN: ? White coat vs essential.  He needs to do more home monitoring to get better idea of this. For now ok to continue HCTZ on weekends only for his kidney problem.  2) GERD: stable on PPI x >6 yrs daily.  He is agreeable to revisit to GI for consideration of repeat EGD as surveillance for Barrett's esophagus.  3) Hyperlipidemia : TLC , recheck Lipids 6 mo.  4) Tob dependence: encouraged pt quit but he is slowly transitioning to using vaps more and is not ready to commit to total cessation at this time.  An After Visit Summary was printed and given to the patient.  FOLLOW  UP: 6 mo

## 2015-04-27 ENCOUNTER — Other Ambulatory Visit: Payer: Self-pay | Admitting: *Deleted

## 2015-04-27 MED ORDER — SILDENAFIL CITRATE 20 MG PO TABS: ORAL_TABLET | ORAL | Status: DC

## 2015-04-27 NOTE — Telephone Encounter (Signed)
Pts wife Larwance Rote on 04/27/15 at 11:03am requesting refill for generic Viagra.   RF request for generic Viagra 20mg  LOV: 12/30/14 Next ov: None Last written: 11/11/14 #50 w/ 3RF  Pts wife advised Rx has been sent.

## 2015-05-23 ENCOUNTER — Other Ambulatory Visit: Payer: Self-pay | Admitting: *Deleted

## 2015-05-23 MED ORDER — HYDROCHLOROTHIAZIDE 25 MG PO TABS: 25.0000 mg | ORAL_TABLET | ORAL | Status: DC

## 2015-05-23 NOTE — Telephone Encounter (Signed)
RF request for hctz LOV: 12/30/14 Next ov: None Last written: 04/13/14 #15 w/ 3RF

## 2015-07-06 ENCOUNTER — Other Ambulatory Visit: Payer: Self-pay | Admitting: *Deleted

## 2015-07-06 MED ORDER — ALCLOMETASONE DIPROPIONATE 0.05 % EX OINT: TOPICAL_OINTMENT | CUTANEOUS | Status: DC

## 2015-07-06 NOTE — Telephone Encounter (Signed)
RF request for alclometasone LOV: 12/30/14 Next ov: None Last written: 07/06/13 #60g w/ 1RF

## 2015-08-05 ENCOUNTER — Ambulatory Visit (INDEPENDENT_AMBULATORY_CARE_PROVIDER_SITE_OTHER): Payer: BLUE CROSS/BLUE SHIELD | Admitting: Family Medicine

## 2015-08-05 ENCOUNTER — Encounter: Payer: Self-pay | Admitting: Family Medicine

## 2015-08-05 VITALS — BP 124/78 | HR 67 | Temp 98.4°F | Resp 16 | Ht 73.0 in | Wt 213.8 lb

## 2015-08-05 DIAGNOSIS — J040 Acute laryngitis: Secondary | ICD-10-CM | POA: Diagnosis not present

## 2015-08-05 MED ORDER — PREDNISONE 20 MG PO TABS: ORAL_TABLET | ORAL | Status: DC

## 2015-08-05 NOTE — Patient Instructions (Signed)
Get otc generic robitussin DM OR Mucinex DM and use as directed on the packaging for cough and congestion. Use otc generic saline nasal spray 2-3 times per day to irrigate/moisturize your nasal passages.   

## 2015-08-05 NOTE — Progress Notes (Signed)
OFFICE VISIT  08/05/2015   CC:  Chief Complaint  Patient presents with  . Hoarse    x 2-3 weeks   HPI:    Patient is a 50 y.o. Caucasian male who presents for about 2 wks of hoarseness of voice.   Felt bad a few days ago--broke out in a sweat, more cough that night.  No documented fevers.  Some PND mucous and cough seems to be getting worse.  Some vague nasal sx's.  Zyrtec use recently helped a little for PND sx's.  Has been doubling up on his acid pill last 4-5 days.  Some rattle/wheeze in chest at night, but no chest tightness or SOB.  Had a similar episode of hoarseness/loss of voice about 4 mo ago.  Past Medical History  Diagnosis Date  . HTN (hypertension)   . Chest pain   . Dyslipidemia   . Tobacco dependence   . GERD (gastroesophageal reflux disease)     s/p EGD 02/2010-erosive esophagitis, no barrett's, H. pylori neg--responded well to PPI  . Nephrolithiasis 12/2009    with hypercalcuria (treated w/HCTZ)  . Hiatal hernia   . Erosive esophagitis   . Abdominal lymphadenopathy     CT f/u 97mo later (06/2012) all was stable: interpreted as reactive/post-infectious-no further f/u indicated.    Past Surgical History  Procedure Laterality Date  . Back surgery  age 32 yrs    Lumbar -2 levels (no hardware)--no signif chronic back problems since  . Esophagogastroduodenoscopy  2011    Esophagitis, H. pylori neg, no Barrett's.    Outpatient Prescriptions Prior to Visit  Medication Sig Dispense Refill  . alclomethasone (ACLOVATE) 0.05 % ointment Apply bid prn to areas of inflammation and itching on your bottom 60 g 1  . B Complex-C (B-COMPLEX WITH VITAMIN C) tablet Take 1 tablet by mouth daily.    . Coenzyme Q10 200 MG capsule Take 200 mg by mouth daily.    . hydrochlorothiazide (HYDRODIURIL) 25 MG tablet Take 1 tablet (25 mg total) by mouth every other day. 15 tablet 3  . Omega 3-6-9 Fatty Acids (TRIPLE OMEGA-3-6-9) CAPS Take 2 capsules by mouth daily.    Marland Kitchen omeprazole  (PRILOSEC) 40 MG capsule TAKE 1 CAPSULE (40 MG TOTAL) BY MOUTH DAILY. 90 capsule 3  . sildenafil (REVATIO) 20 MG tablet 2-5 tabs po qd prn 30 min prior to sexual activity 50 tablet 3  . terbinafine (LAMISIL) 250 MG tablet Take 1 tablet (250 mg total) by mouth daily. 90 tablet 0  . zinc gluconate 50 MG tablet Take 50 mg by mouth daily.    . sildenafil (VIAGRA) 100 MG tablet Take 1 tablet (100 mg total) by mouth daily as needed for erectile dysfunction. (Patient not taking: Reported on 08/05/2015) 3 tablet 12  . sildenafil (VIAGRA) 50 MG tablet Take 1 tablet (50 mg total) by mouth daily as needed for erectile dysfunction. (Patient not taking: Reported on 08/05/2015) 6 tablet 6   No facility-administered medications prior to visit.    No Known Allergies  ROS As per HPI  PE: Blood pressure 124/78, pulse 67, temperature 98.4 F (36.9 C), temperature source Oral, resp. rate 16, height 6\' 1"  (1.854 m), weight 213 lb 12 oz (96.956 kg), SpO2 94 %. VS: noted--normal. Gen: alert, NAD, NONTOXIC APPEARING. HEENT: eyes without injection, drainage, or swelling.  Ears: EACs clear, TMs with normal light reflex and landmarks.  Nose: Clear rhinorrhea, with some dried, crusty exudate adherent to mildly injected mucosa.  No purulent  d/c.  No paranasal sinus TTP.  No facial swelling.  Throat and mouth without focal lesion.  No pharyngial swelling, erythema, or exudate.   Neck: supple, no LAD.   LUNGS: CTA bilat, nonlabored resps.   CV: RRR, no m/r/g. EXT: no c/c/e SKIN: no rash  LABS:  none  IMPRESSION AND PLAN:  Viral URI with acute laryngitis, possibly with component of acute bronchitis. Prednisone 40mg  qd x 5d, then 20mg  qd x 5d. Get otc generic robitussin DM OR Mucinex DM and use as directed on the packaging for cough and congestion. Use otc generic saline nasal spray 2-3 times per day to irrigate/moisturize your nasal passages.  An After Visit Summary was printed and given to the patient.  FOLLOW  UP: Return if symptoms worsen or fail to improve.

## 2015-08-05 NOTE — Progress Notes (Signed)
Pre visit review using our clinic review tool, if applicable. No additional management support is needed unless otherwise documented below in the visit note. 

## 2015-08-19 ENCOUNTER — Encounter: Payer: Self-pay | Admitting: Family Medicine

## 2015-08-19 NOTE — Telephone Encounter (Signed)
Please advise. Thanks.  

## 2016-03-13 ENCOUNTER — Other Ambulatory Visit: Payer: Self-pay

## 2016-03-13 MED ORDER — OMEPRAZOLE 40 MG PO CPDR: DELAYED_RELEASE_CAPSULE | ORAL | 0 refills | Status: DC

## 2016-03-13 NOTE — Telephone Encounter (Signed)
Refill for Omeprazole sent to [facility]

## 2016-06-08 ENCOUNTER — Other Ambulatory Visit: Payer: Self-pay | Admitting: Family Medicine

## 2016-06-11 NOTE — Telephone Encounter (Signed)
Pt advised and voiced understanding.  Pt stated that he will call back to schedule f/u.

## 2016-06-11 NOTE — Telephone Encounter (Signed)
RF request for omeprazole LOV: 12/30/14 Next ov: None Last written: 03/13/16 #90 w/ 0RF  Rx sent for #30 w/ 0RF. Pt is due for f/u [facility], needs office visit for more refills.

## 2016-09-05 ENCOUNTER — Other Ambulatory Visit: Payer: Self-pay | Admitting: Family Medicine

## 2016-09-05 NOTE — Telephone Encounter (Signed)
Patient notified and verbalized understanding. 

## 2016-09-05 NOTE — Telephone Encounter (Signed)
I'll authorize RF of his viagra but pls remind him he is due for annual fasting physical exam.-thx

## 2017-12-16 ENCOUNTER — Encounter: Payer: Self-pay | Admitting: Family Medicine

## 2017-12-16 ENCOUNTER — Ambulatory Visit: Payer: BLUE CROSS/BLUE SHIELD | Admitting: Family Medicine

## 2017-12-16 VITALS — BP 132/83 | HR 68 | Temp 98.3°F | Resp 16 | Ht 72.0 in | Wt 202.4 lb

## 2017-12-16 DIAGNOSIS — R51 Headache: Secondary | ICD-10-CM | POA: Diagnosis not present

## 2017-12-16 DIAGNOSIS — F17200 Nicotine dependence, unspecified, uncomplicated: Secondary | ICD-10-CM | POA: Diagnosis not present

## 2017-12-16 DIAGNOSIS — R519 Headache, unspecified: Secondary | ICD-10-CM

## 2017-12-16 DIAGNOSIS — Z716 Tobacco abuse counseling: Secondary | ICD-10-CM

## 2017-12-16 NOTE — Progress Notes (Signed)
OFFICE VISIT  12/18/2017   CC:  Chief Complaint  Patient presents with  . Pain in back of head after straining    Family Hx of brain tumors   HPI:    Patient is a 52 y.o. Caucasian male who presents for  Just on weekends he gets a pain in back of head when on the floor playing with the dog-does not happen every time he does this though. He can be working and doing other activities and it does not happen.  No hx of trauma.  No recollection of acute neck strain.  Has been occurring for 4-5 months at least. Eating well.  No undesired wt loss.  No focal weakness.  Occ tingling/numbness in fingers.  Shakes them out and this resolves.  No vision or hearing changes. No tremors.  No dizziness.  No vertigo.  No headaches.  No loss of bowel/bladder control. Again, this pain in back of head ONLY occurs when he is on his floor playing with his dog. He points to lower occipital region diffusely.  Dad has a brain tumor---"but it's not growing".  Pt is a long time smoker: 35 pack-yr hx of strong cigs, wants to quit.  Can't tolerate chantix. He asks for advise about using nicotine replacement for cessation.  ROS: no cough, no hemoptysis, no SOB, no CP.  No abd pain.  No melena.  Past Medical History:  Diagnosis Date  . Abdominal lymphadenopathy    CT f/u 53mo later (06/2012) all was stable: interpreted as reactive/post-infectious-no further f/u indicated.  . Chest pain   . Dyslipidemia   . Erosive esophagitis   . GERD (gastroesophageal reflux disease)    s/p EGD 02/2010-erosive esophagitis, no barrett's, H. pylori neg--responded well to PPI  . Hiatal hernia   . HTN (hypertension)   . Nephrolithiasis 12/2009   with hypercalcuria (treated w/HCTZ)  . Tobacco dependence     Past Surgical History:  Procedure Laterality Date  . BACK SURGERY  age 52 yrs   Lumbar -2 levels (no hardware)--no signif chronic back problems since  . ESOPHAGOGASTRODUODENOSCOPY  2011   Esophagitis, H. pylori neg, no  Barrett's.    Outpatient Medications Prior to Visit  Medication Sig Dispense Refill  . alclomethasone (ACLOVATE) 0.05 % ointment Apply bid prn to areas of inflammation and itching on your bottom 60 g 1  . omeprazole (PRILOSEC) 40 MG capsule TAKE 1 CAPSULE DAILY 30 capsule 0  . sildenafil (REVATIO) 20 MG tablet TAKE 2-5 TABLETS BY MOUTH EVERY DAY AS NEEDED 30 MINUTES PRIOR TO SEXUAL ACTIVITY 50 tablet 3  . B Complex-C (B-COMPLEX WITH VITAMIN C) tablet Take 1 tablet by mouth daily.    . Coenzyme Q10 200 MG capsule Take 200 mg by mouth daily.    . hydrochlorothiazide (HYDRODIURIL) 25 MG tablet Take 1 tablet (25 mg total) by mouth every other day. (Patient not taking: Reported on 12/16/2017) 15 tablet 3  . Omega 3-6-9 Fatty Acids (TRIPLE OMEGA-3-6-9) CAPS Take 2 capsules by mouth daily.    . predniSONE (DELTASONE) 20 MG tablet 2 tabs po qd x 5d, then 1 tab po qd x 5d (Patient not taking: Reported on 12/16/2017) 15 tablet 0  . terbinafine (LAMISIL) 250 MG tablet Take 1 tablet (250 mg total) by mouth daily. (Patient not taking: Reported on 12/16/2017) 90 tablet 0  . zinc gluconate 50 MG tablet Take 50 mg by mouth daily.     No facility-administered medications prior to visit.  No Known Allergies  ROS As per HPI  PE: Blood pressure 132/83, pulse 68, temperature 98.3 F (36.8 C), temperature source Oral, resp. rate 16, height 6' (1.829 m), weight 202 lb 6 oz (91.8 kg), SpO2 96 %. Gen: Alert, well appearing.  Patient is oriented to person, place, time, and situation. AFFECT: pleasant, lucid thought and speech. HUT:MLYY: no injection, icteris, swelling, or exudate.  EOMI, PERRLA. Mouth: lips without lesion/swelling.  Oral mucosa pink and moist. Oropharynx without erythema, exudate, or swelling.  CV: RRR, no m/r/g.   LUNGS: CTA bilat, nonlabored resps, good aeration in all lung fields. Neuro: CN 2-12 intact bilaterally, strength 5/5 in proximal and distal upper extremities and lower extremities  bilaterally.  No sensory deficits.  No tremor.  No disdiadochokinesis.  No ataxia.  Upper extremity and lower extremity DTRs symmetric.  No pronator drift. Head: atraumatic, normocephalic.  He has no bruising or abrasions. He is nontender on scalp and occipitalis and upper trapezius mm's.   No nodules or asymmetry.  LABS:  none  IMPRESSION AND PLAN:  1) Occipital pain: isolated to one situation + not consistently happening with this situation. Neuro exam normal.  Suspect he is having some occipitalis and upper trapezius mm strain and spasm when he wrestles with his dog. Reassured pt. Signs/symptoms to call or return for were reviewed and pt expressed understanding.  2) Tobacco dependence: discussed cessation for 10 min today. Instructions: Stop smoking: Buy over the counter nicotine patches: 21 mg--> apply 1 patch to skin every 24h for 1 month. Then apply one 14 mg patch to skin every 24h for another 1 month. Then apply one 7 mg patch to skin every 24 h for 1 month, then stop nicotine patch. You may use nicotine lozenges, 1 lozenge up to 4 times per day for "cravings", even while you are on the patch.  An After Visit Summary was printed and given to the patient.  FOLLOW UP: Return for as needed.  Signed:  Crissie Sickles, MD           12/18/2017

## 2017-12-16 NOTE — Patient Instructions (Signed)
Buy over the counter nicotine patches: 21 mg--> apply 1 patch to skin every 24h for 1 month. Then apply one 14 mg patch to skin every 24h for another 1 month. Then apply one 7 mg patch to skin every 24 h for 1 month, then stop nicotine patch.  You may use nicotine lozenges, 1 lozenge up to 4 times per day for "cravings", even while you are on the patch.

## 2017-12-19 ENCOUNTER — Other Ambulatory Visit: Payer: Self-pay | Admitting: Family Medicine

## 2017-12-30 ENCOUNTER — Encounter: Payer: Self-pay | Admitting: Family Medicine

## 2018-01-01 MED ORDER — ALCLOMETASONE DIPROPIONATE 0.05 % EX OINT: TOPICAL_OINTMENT | CUTANEOUS | 3 refills | Status: DC

## 2018-01-01 NOTE — Telephone Encounter (Signed)
OK, aclometasone eRx'd.

## 2018-01-02 NOTE — Telephone Encounter (Signed)
I sent his aclometasone 01/01/18.

## 2018-03-27 ENCOUNTER — Other Ambulatory Visit: Payer: Self-pay | Admitting: Family Medicine

## 2018-03-27 NOTE — Telephone Encounter (Signed)
No office visit for this in the last year. Please advise. Thanks.

## 2018-05-27 ENCOUNTER — Ambulatory Visit: Payer: BLUE CROSS/BLUE SHIELD | Admitting: Family Medicine

## 2018-05-27 ENCOUNTER — Encounter: Payer: Self-pay | Admitting: *Deleted

## 2018-05-27 ENCOUNTER — Other Ambulatory Visit: Payer: Self-pay

## 2018-05-27 ENCOUNTER — Emergency Department (INDEPENDENT_AMBULATORY_CARE_PROVIDER_SITE_OTHER)
Admission: EM | Admit: 2018-05-27 | Discharge: 2018-05-27 | Disposition: A | Discharge status: Home / Self Care | Payer: BLUE CROSS/BLUE SHIELD | Source: Home / Self Care | Attending: Family Medicine | Admitting: Family Medicine

## 2018-05-27 ENCOUNTER — Emergency Department (INDEPENDENT_AMBULATORY_CARE_PROVIDER_SITE_OTHER): Payer: BLUE CROSS/BLUE SHIELD

## 2018-05-27 DIAGNOSIS — R0600 Dyspnea, unspecified: Secondary | ICD-10-CM | POA: Diagnosis not present

## 2018-05-27 DIAGNOSIS — R0609 Other forms of dyspnea: Secondary | ICD-10-CM

## 2018-05-27 DIAGNOSIS — J209 Acute bronchitis, unspecified: Secondary | ICD-10-CM | POA: Diagnosis not present

## 2018-05-27 LAB — POCT CBC W AUTO DIFF (K'VILLE URGENT CARE)

## 2018-05-27 MED ORDER — GUAIFENESIN-CODEINE 100-10 MG/5ML PO SOLN: ORAL | 0 refills | Status: DC

## 2018-05-27 MED ORDER — METHYLPREDNISOLONE SODIUM SUCC 125 MG IJ SOLR
80.0000 mg | Freq: Once | INTRAMUSCULAR | Status: AC
  Administered 2018-05-27: 80 mg via INTRAMUSCULAR

## 2018-05-27 MED ORDER — PREDNISONE 20 MG PO TABS: ORAL_TABLET | ORAL | 0 refills | Status: DC

## 2018-05-27 MED ORDER — AZITHROMYCIN 250 MG PO TABS: ORAL_TABLET | ORAL | 0 refills | Status: DC

## 2018-05-27 NOTE — ED Triage Notes (Signed)
Patient c/o non-productive cough x 4 days with increasing SOB with minimal activity. Hot/cold flashes. Used DayQuil and Mucinex OTC. Current smoker. No flu vac this season.

## 2018-05-27 NOTE — ED Provider Notes (Signed)
[facility]    CSN: [id number] Arrival date & time: 05/27/18  0803     History   Chief Complaint Chief Complaint  Patient presents with  . Cough    HPI Richard Winters is a 52 y.o. male.   Patient developed URI symptoms five days ago with scratchy throat, sinus congestion, headache, fatigue, and non-productive cough. The cough has persisted and he complains of frequent dyspnea with any exertion.  His cough is worse at night.  He denies wheezing, pleuritic pain, but has had frequent chills/night sweats.  He tried his wife's albuterol inhaler without much improvement. Patient is a smoker and admits that he has had increasing nocturnal cough for at least a year.  Family history of asthma (mother)   The history is provided by the patient.    Past Medical History:  Diagnosis Date  . Abdominal lymphadenopathy    CT f/u 52mo later (06/2012) all was stable: interpreted as reactive/post-infectious-no further f/u indicated.  . Chest pain   . Dyslipidemia   . Erosive esophagitis   . GERD (gastroesophageal reflux disease)    s/p EGD 02/2010-erosive esophagitis, no barrett's, H. pylori neg--responded well to PPI  . Hiatal hernia   . HTN (hypertension)   . Nephrolithiasis 12/2009   with hypercalcuria (treated w/HCTZ)  . Tobacco dependence     Patient Active Problem List   Diagnosis Date Noted  . GERD (gastroesophageal reflux disease) 12/30/2014  . HTN (hypertension), benign 09/22/2013  . Low HDL (under 40) 09/22/2013  . Tobacco dependence 09/22/2013  . Health maintenance examination 06/19/2013  . Dermatitis 02/18/2013  . Onychomycosis 10/08/2012  . Erectile dysfunction 10/08/2012    Past Surgical History:  Procedure Laterality Date  . BACK SURGERY  age 60 yrs   Lumbar -2 levels (no hardware)--no signif chronic back problems since  . ESOPHAGOGASTRODUODENOSCOPY  2011   Esophagitis, H. pylori neg, no Barrett's.       Home Medications    Prior to Admission  medications   Medication Sig Start Date End Date Taking? Authorizing Provider  alclomethasone (ACLOVATE) 0.05 % ointment Apply bid prn to areas of inflammation and itching on your bottom 01/01/18   McGowen, Adrian Blackwater, MD  azithromycin (ZITHROMAX Z-PAK) 250 MG tablet Take 2 tabs today; then begin one tab once daily for 4 more days. 05/27/18   Kandra Nicolas, MD  guaiFENesin-codeine 100-10 MG/5ML syrup Take 3mL by mouth at bedtime as needed for cough.  May repeat dose in 4 to 6 hours. 05/27/18   Kandra Nicolas, MD  omeprazole (PRILOSEC) 40 MG capsule TAKE 1 CAPSULE DAILY 12/20/17   McGowen, Adrian Blackwater, MD  predniSONE (DELTASONE) 20 MG tablet Take one tab by mouth twice daily for 4 days, then one daily for 3 days. Take with food. 05/27/18   Kandra Nicolas, MD    Family History Family History  Problem Relation Age of Onset  . Heart attack Father   . Hypertension Father   . Diabetes Maternal Grandmother   . Diabetes Maternal Grandfather     Social History Social History   Tobacco Use  . Smoking status: Current Every Day Smoker  . Smokeless tobacco: Never Used  Substance Use Topics  . Alcohol use: Yes  . Drug use: No     Allergies   Patient has no known allergies.   Review of Systems Review of Systems  Constitutional: Positive for activity change, chills, diaphoresis and fatigue. Negative for appetite change, fever and unexpected  weight change.  HENT: Positive for congestion, postnasal drip and sore throat. Negative for ear pain, facial swelling, sinus pressure and sinus pain.   Eyes: Negative.   Respiratory: Positive for cough, chest tightness and shortness of breath. Negative for choking, wheezing and stridor.   Cardiovascular: Negative.   Gastrointestinal: Negative.   Genitourinary: Negative.   Musculoskeletal: Negative.   Neurological: Positive for headaches.     Physical Exam Triage Vital Signs ED Triage Vitals  Enc Vitals Group     BP 05/27/18 0828 124/80     Pulse  Rate 05/27/18 0828 62     Resp --      Temp 05/27/18 0828 98 F (36.7 C)     Temp Source 05/27/18 0828 Oral     SpO2 05/27/18 0828 94 %     Weight 05/27/18 0829 205 lb (93 kg)     Height --      Head Circumference --      Peak Flow --      Pain Score 05/27/18 0829 0     Pain Loc --      Pain Edu? --      Excl. in [place]? --    No data found.  Updated Vital Signs BP 124/80 (BP Location: Right Arm)   Pulse 62   Temp 98 F (36.7 C) (Oral)   Wt 93 kg   SpO2 94%   BMI 27.80 kg/m   Visual Acuity Right Eye Distance:   Left Eye Distance:   Bilateral Distance:    Right Eye Near:   Left Eye Near:    Bilateral Near:     Physical Exam Nursing notes and Vital Signs reviewed. Appearance:  Patient appears stated age, and in no acute distress Eyes:  Pupils are equal, round, and reactive to light and accomodation.  Extraocular movement is intact.  Conjunctivae are not inflamed  Ears:  Canals normal but partly occluded with cerumen.  Tympanic membranes normal.  Nose:  Congested turbinates.  No sinus tenderness.   Pharynx:  Normal Neck:  Supple.  Mildly enlarged but nontender posterior/lateral nodes are palpated bilaterally.   Lungs:  Clear to auscultation.  Breath sounds are equal.  Moving air well. Heart:  Regular rate and rhythm without murmurs, rubs, or gallops.  Abdomen:  Nontender without masses or hepatosplenomegaly.  Bowel sounds are present.  No CVA or flank tenderness.  Extremities:  No edema.  Skin:  No rash present.    UC Treatments / Results  Labs (all labs ordered are listed, but only abnormal results are displayed) Labs Reviewed  POCT CBC W AUTO DIFF ([facility]):  WBC 5.4; LY 31.4; MO 3.8; GR 64.8; Hgb 16.1; Platelets 191     EKG None  Radiology Dg Chest 2 View  Result Date: 05/27/2018 CLINICAL DATA:  Dyspnea. EXAM: CHEST - 2 VIEW COMPARISON:  None. FINDINGS: The heart size and mediastinal contours are within normal limits. Both lungs are clear. No  pneumothorax or pleural effusion is noted. The visualized skeletal structures are unremarkable. IMPRESSION: No active cardiopulmonary disease. Electronically Signed   By: Marijo Conception, M.D.   On: 05/27/2018 09:14    Procedures Procedures (including critical care time)  Medications Ordered in UC Medications  methylPREDNISolone sodium succinate (SOLU-MEDROL) 125 mg/2 mL injection 80 mg (80 mg Intramuscular Given 05/27/18 0942)    Initial Impression / Assessment and Plan / UC Course  I have reviewed the triage vital signs and the nursing notes.  Pertinent  labs & imaging results that were available during my care of the patient were reviewed by me and considered in my medical decision making (see chart for details).    Normal chest x-ray and WBC reassuring. Administered Solumedrol 80mg  IM. Begin empiric Z-pak.  Rx for Robitussin AC for night time cough.  Controlled Substance Prescriptions I have consulted the [place] Controlled Substances Registry for this patient, and feel the risk/benefit ratio today is favorable for proceeding with this prescription for a controlled substance.   Followup with Family Doctor if not improved in one week.  Followup with pulmonologist if dyspnea persists.   Final Clinical Impressions(s) / UC Diagnoses   Final diagnoses:  Acute bronchitis, unspecified organism  Dyspnea on exertion     Discharge Instructions     Begin prednisone Wednesday 05/27/18. Take plain guaifenesin (1200mg  extended release tabs such as Mucinex) twice daily, with plenty of water, for cough and congestion.  May add Pseudoephedrine (30mg , one or two every 4 to 6 hours) for sinus congestion.  Get adequate rest.   Stop all antihistamines for now, and other non-prescription cough/cold preparations. May take Tylenol as needed for headache, fever, etc.   Recommend a flu shot when well.     ED Prescriptions    Medication Sig Dispense Auth. Provider   predniSONE (DELTASONE) 20 MG  tablet Take one tab by mouth twice daily for 4 days, then one daily for 3 days. Take with food. 11 tablet Kandra Nicolas, MD   azithromycin (ZITHROMAX Z-PAK) 250 MG tablet Take 2 tabs today; then begin one tab once daily for 4 more days. 6 tablet Kandra Nicolas, MD   guaiFENesin-codeine 100-10 MG/5ML syrup Take 67mL by mouth at bedtime as needed for cough.  May repeat dose in 4 to 6 hours. 100 mL Kandra Nicolas, MD        Kandra Nicolas, MD 05/29/18 1058

## 2018-05-27 NOTE — Discharge Instructions (Addendum)
Begin prednisone Wednesday 05/27/18. Take plain guaifenesin (1200mg  extended release tabs such as Mucinex) twice daily, with plenty of water, for cough and congestion.  May add Pseudoephedrine (30mg , one or two every 4 to 6 hours) for sinus congestion.  Get adequate rest.   Stop all antihistamines for now, and other non-prescription cough/cold preparations. May take Tylenol as needed for headache, fever, etc.   Recommend a flu shot when well.

## 2018-05-28 ENCOUNTER — Ambulatory Visit: Payer: BLUE CROSS/BLUE SHIELD | Admitting: Family Medicine

## 2018-07-02 ENCOUNTER — Other Ambulatory Visit: Payer: Self-pay | Admitting: Family Medicine

## 2018-08-08 DIAGNOSIS — R599 Enlarged lymph nodes, unspecified: Secondary | ICD-10-CM | POA: Diagnosis not present

## 2018-08-11 ENCOUNTER — Ambulatory Visit: Payer: BLUE CROSS/BLUE SHIELD | Admitting: Family Medicine

## 2018-11-12 ENCOUNTER — Ambulatory Visit (INDEPENDENT_AMBULATORY_CARE_PROVIDER_SITE_OTHER): Payer: BLUE CROSS/BLUE SHIELD | Admitting: Family Medicine

## 2018-11-12 ENCOUNTER — Encounter: Payer: Self-pay | Admitting: Family Medicine

## 2018-11-12 VITALS — BP 128/85 | HR 77 | Resp 16 | Ht 72.0 in | Wt 209.2 lb

## 2018-11-12 DIAGNOSIS — K219 Gastro-esophageal reflux disease without esophagitis: Secondary | ICD-10-CM

## 2018-11-12 DIAGNOSIS — J029 Acute pharyngitis, unspecified: Secondary | ICD-10-CM | POA: Diagnosis not present

## 2018-11-12 MED ORDER — OMEPRAZOLE 40 MG PO CPDR: 40.0000 mg | DELAYED_RELEASE_CAPSULE | Freq: Two times a day (BID) | ORAL | 0 refills | Status: DC

## 2018-11-12 NOTE — Progress Notes (Signed)
OFFICE VISIT  11/12/2018   CC:  Chief Complaint  Patient presents with  . Sore Throat    severe, mucus buildup   HPI:    Patient is a 53 y.o. Caucasian male who presents for sore throat.  Onset of ST when swallowing 4-5 d/a, worsening, lots of "mucous" in throat.  Clearing throat excessively lately.  No signif PND or nasal mucous/congestion.  No face or peri-orbital pain, no teeth pain. No classic heartburn sx's.  No excessive salivation.  No cough.  Voice a bit more hoarse as he talks quite a bit. No fever or malaise or fatigue or body aches. Takes prilosec every night about 11 o'clock. Lots more fast food lately.  More [place] lately, cut back since throat started hurting.  ROS: no CP, no SOB, no wheezing, no cough, no dizziness, no HAs, no rashes, no melena/hematochezia.  No polyuria or polydipsia.  No myalgias or arthralgias.  Past Medical History:  Diagnosis Date  . Abdominal lymphadenopathy    CT f/u 27mo later (06/2012) all was stable: interpreted as reactive/post-infectious-no further f/u indicated.  . Chest pain   . Dyslipidemia   . Erosive esophagitis   . GERD (gastroesophageal reflux disease)    s/p EGD 02/2010-erosive esophagitis, no barrett's, H. pylori neg--responded well to PPI  . Hiatal hernia   . HTN (hypertension)   . Nephrolithiasis 12/2009   with hypercalcuria (treated w/HCTZ)  . Tobacco dependence     Past Surgical History:  Procedure Laterality Date  . BACK SURGERY  age 52 yrs   Lumbar -2 levels (no hardware)--no signif chronic back problems since  . ESOPHAGOGASTRODUODENOSCOPY  2011   Esophagitis, H. pylori neg, no Barrett's.    Outpatient Medications Prior to Visit  Medication Sig Dispense Refill  . omeprazole (PRILOSEC) 40 MG capsule TAKE 1 CAPSULE DAILY 90 capsule 1  . alclomethasone (ACLOVATE) 0.05 % ointment Apply bid prn to areas of inflammation and itching on your bottom 60 g 3  . azithromycin (ZITHROMAX Z-PAK) 250 MG tablet Take 2 tabs today;  then begin one tab once daily for 4 more days. 6 tablet 0  . guaiFENesin-codeine 100-10 MG/5ML syrup Take 36mL by mouth at bedtime as needed for cough.  May repeat dose in 4 to 6 hours. 100 mL 0  . predniSONE (DELTASONE) 20 MG tablet Take one tab by mouth twice daily for 4 days, then one daily for 3 days. Take with food. 11 tablet 0   No facility-administered medications prior to visit.     No Known Allergies  ROS As per HPI  PE: Blood pressure 128/85, pulse 77, resp. rate 16, height 6' (1.829 m), weight 209 lb 3.2 oz (94.9 kg). Gen: Alert, well appearing.  Patient is oriented to person, place, time, and situation. AFFECT: pleasant, lucid thought and speech. EHU:DJSH: no injection, icteris, swelling, or exudate.  EOMI, PERRLA. Mouth: lips without lesion/swelling.  Oral mucosa pink and moist. Oropharynx with mild diffuse erythema but no exudate or swelling.  Neck w/out swelling, tenderness, LAD, or TM. CV: RRR, no m/r/g.   LUNGS: CTA bilat, nonlabored resps, good aeration in all lung fields. EXT: no clubbing or cyanosis.  no edema.  Marland Kitchen   LABS:    Chemistry      Component Value Date/Time   NA 139 06/17/2014 0909   K 5.0 06/17/2014 0909   CL 104 06/17/2014 0909   CO2 26 06/17/2014 0909   BUN 14 06/17/2014 0909   CREATININE 0.9 06/17/2014 0909  Component Value Date/Time   CALCIUM 9.3 06/17/2014 0909   ALKPHOS 75 06/17/2014 0909   AST 21 06/17/2014 0909   ALT 30 06/17/2014 0909   BILITOT 1.1 06/17/2014 0909       IMPRESSION AND PLAN:  Sore throat, suspected due to laryngopharyngeal reflux. Reviewed diet for GER/LPR in detail today: he will work on [employer] intake as well as fast food.  Increase omeprazole to 40mg  BID for the next 7-10d. Signs/symptoms to call or return for were reviewed and pt expressed understanding.  Spent 25 min with pt today, with >50% of this time spent in counseling and care coordination regarding the above problems.  An After Visit  Summary was printed and given to the patient.  FOLLOW UP: Return if symptoms worsen or fail to improve.  Signed:  Crissie Sickles, MD           11/12/2018

## 2018-11-12 NOTE — Patient Instructions (Signed)
Gastroesophageal Reflux Disease, Adult Gastroesophageal reflux (GER) happens when acid from the stomach flows up into the tube that connects the mouth and the stomach (esophagus). Normally, food travels down the esophagus and stays in the stomach to be digested. However, when a person has GER, food and stomach acid sometimes move back up into the esophagus. If this becomes a more serious problem, the person may be diagnosed with a disease called gastroesophageal reflux disease (GERD). GERD occurs when the reflux:  Happens often.  Causes frequent or severe symptoms.  Causes problems such as damage to the esophagus. When stomach acid comes in contact with the esophagus, the acid may cause soreness (inflammation) in the esophagus. Over time, GERD may create small holes (ulcers) in the lining of the esophagus. What are the causes? This condition is caused by a problem with the muscle between the esophagus and the stomach (lower esophageal sphincter, or LES). Normally, the LES muscle closes after food passes through the esophagus to the stomach. When the LES is weakened or abnormal, it does not close properly, and that allows food and stomach acid to go back up into the esophagus. The LES can be weakened by certain dietary substances, medicines, and medical conditions, including:  Tobacco use.  Pregnancy.  Having a hiatal hernia.  Alcohol use.  Certain foods and beverages, such as coffee, chocolate, onions, and peppermint. What increases the risk? You are more likely to develop this condition if you:  Have an increased body weight.  Have a connective tissue disorder.  Use NSAID medicines. What are the signs or symptoms? Symptoms of this condition include:  Heartburn.  Difficult or painful swallowing.  The feeling of having a lump in the throat.  Abitter taste in the mouth.  Bad breath.  Having a large amount of saliva.  Having an upset or bloated  stomach.  Belching.  Chest pain. Different conditions can cause chest pain. Make sure you see your health care provider if you experience chest pain.  Shortness of breath or wheezing.  Ongoing (chronic) cough or a night-time cough.  Wearing away of tooth enamel.  Weight loss. How is this diagnosed? Your health care provider will take a medical history and perform a physical exam. To determine if you have mild or severe GERD, your health care provider may also monitor how you respond to treatment. You may also have tests, including:  A test to examine your stomach and esophagus with a small camera (endoscopy).  A test thatmeasures the acidity level in your esophagus.  A test thatmeasures how much pressure is on your esophagus.  A barium swallow or modified barium swallow test to show the shape, size, and functioning of your esophagus. How is this treated? The goal of treatment is to help relieve your symptoms and to prevent complications. Treatment for this condition may vary depending on how severe your symptoms are. Your health care provider may recommend:  Changes to your diet.  Medicine.  Surgery. Follow these instructions at home: Eating and drinking   Follow a diet as recommended by your health care provider. This may involve avoiding foods and drinks such as: ? Coffee and tea (with or without caffeine). ? Drinks that containalcohol. ? Energy drinks and sports drinks. ? Carbonated drinks or sodas. ? Chocolate and cocoa. ? Peppermint and mint flavorings. ? Garlic and onions. ? Horseradish. ? Spicy and acidic foods, including peppers, chili powder, curry powder, vinegar, hot sauces, and barbecue sauce. ? Citrus fruit juices and citrus   fruits, such as oranges, lemons, and limes. ? Tomato-based foods, such as red sauce, chili, salsa, and pizza with red sauce. ? Fried and fatty foods, such as donuts, french fries, potato chips, and high-fat dressings. ? High-fat  meats, such as hot dogs and fatty cuts of red and white meats, such as rib eye steak, sausage, ham, and bacon. ? High-fat dairy items, such as whole milk, butter, and cream cheese.  Eat small, frequent meals instead of large meals.  Avoid drinking large amounts of liquid with your meals.  Avoid eating meals during the 2-3 hours before bedtime.  Avoid lying down right after you eat.  Do not exercise right after you eat. Lifestyle   Do not use any products that contain nicotine or tobacco, such as cigarettes, e-cigarettes, and chewing tobacco. If you need help quitting, ask your health care provider.  Try to reduce your stress by using methods such as yoga or meditation. If you need help reducing stress, ask your health care provider.  If you are overweight, reduce your weight to an amount that is healthy for you. Ask your health care provider for guidance about a safe weight loss goal. General instructions  Pay attention to any changes in your symptoms.  Take over-the-counter and prescription medicines only as told by your health care provider. Do not take aspirin, ibuprofen, or other NSAIDs unless your health care provider told you to do so.  Wear loose-fitting clothing. Do not wear anything tight around your waist that causes pressure on your abdomen.  Raise (elevate) the head of your bed about 6 inches (15 cm).  Avoid bending over if this makes your symptoms worse.  Keep all follow-up visits as told by your health care provider. This is important. Contact a health care provider if:  You have: ? New symptoms. ? Unexplained weight loss. ? Difficulty swallowing or it hurts to swallow. ? Wheezing or a persistent cough. ? A hoarse voice.  Your symptoms do not improve with treatment. Get help right away if you:  Have pain in your arms, neck, jaw, teeth, or back.  Feel sweaty, dizzy, or light-headed.  Have chest pain or shortness of breath.  Vomit and your vomit looks  like blood or coffee grounds.  Faint.  Have stool that is bloody or black.  Cannot swallow, drink, or eat. Summary  Gastroesophageal reflux happens when acid from the stomach flows up into the esophagus. GERD is a disease in which the reflux happens often, causes frequent or severe symptoms, or causes problems such as damage to the esophagus.  Treatment for this condition may vary depending on how severe your symptoms are. Your health care provider may recommend diet and lifestyle changes, medicine, or surgery.  Contact a health care provider if you have new or worsening symptoms.  Take over-the-counter and prescription medicines only as told by your health care provider. Do not take aspirin, ibuprofen, or other NSAIDs unless your health care provider told you to do so.  Keep all follow-up visits as told by your health care provider. This is important. This information is not intended to replace advice given to you by your health care provider. Make sure you discuss any questions you have with your health care provider. Document Released: 03/21/2005 Document Revised: 12/18/2017 Document Reviewed: 12/18/2017 Elsevier Interactive Patient Education  2019 Elsevier Inc.  

## 2019-01-08 ENCOUNTER — Encounter: Payer: Self-pay | Admitting: Family Medicine

## 2019-01-08 DIAGNOSIS — L989 Disorder of the skin and subcutaneous tissue, unspecified: Secondary | ICD-10-CM

## 2019-01-09 NOTE — Telephone Encounter (Signed)
Derm referral to Dr. Allyson Sabal ordered today.

## 2019-01-16 ENCOUNTER — Other Ambulatory Visit: Payer: Self-pay | Admitting: Family Medicine

## 2019-01-16 NOTE — Telephone Encounter (Signed)
RF request for Prilosec LOV: 11/12/18 Next ov: Not scheduled Last written: 11/12/18 no refills  Please advise

## 2019-01-30 ENCOUNTER — Encounter: Payer: Self-pay | Admitting: Family Medicine

## 2019-02-09 DIAGNOSIS — L821 Other seborrheic keratosis: Secondary | ICD-10-CM | POA: Diagnosis not present

## 2019-02-09 DIAGNOSIS — D485 Neoplasm of uncertain behavior of skin: Secondary | ICD-10-CM | POA: Diagnosis not present

## 2019-02-09 DIAGNOSIS — D225 Melanocytic nevi of trunk: Secondary | ICD-10-CM | POA: Diagnosis not present

## 2019-02-09 DIAGNOSIS — L570 Actinic keratosis: Secondary | ICD-10-CM | POA: Diagnosis not present

## 2019-02-09 DIAGNOSIS — D1801 Hemangioma of skin and subcutaneous tissue: Secondary | ICD-10-CM | POA: Diagnosis not present

## 2019-02-09 DIAGNOSIS — C44329 Squamous cell carcinoma of skin of other parts of face: Secondary | ICD-10-CM | POA: Diagnosis not present

## 2019-03-12 DIAGNOSIS — C44329 Squamous cell carcinoma of skin of other parts of face: Secondary | ICD-10-CM | POA: Diagnosis not present

## 2019-09-24 ENCOUNTER — Ambulatory Visit: Payer: BC Managed Care – PPO | Attending: Internal Medicine

## 2019-09-24 DIAGNOSIS — Z23 Encounter for immunization: Secondary | ICD-10-CM

## 2019-09-24 NOTE — Progress Notes (Signed)
   Covid-19 Vaccination Clinic  Name:  Richard Winters    MRN: [id number] DOB: 12-29-1965  09/24/2019  Richard Winters was observed post Covid-19 immunization for 15 minutes without incident. He was provided with Vaccine Information Sheet and instruction to access the V-Safe system.   Richard Winters was instructed to call 911 with any severe reactions post vaccine: Marland Kitchen Difficulty breathing  . Swelling of face and throat  . A fast heartbeat  . A bad rash all over body  . Dizziness and weakness   Immunizations Administered    Name Date Dose VIS Date Route   Pfizer COVID-19 Vaccine 09/24/2019  2:20 PM 0.3 mL 06/05/2019 Intramuscular   Manufacturer: [place]   Lot: [id number]   [place]: [id number]

## 2019-10-05 DIAGNOSIS — L905 Scar conditions and fibrosis of skin: Secondary | ICD-10-CM | POA: Diagnosis not present

## 2019-10-05 DIAGNOSIS — Z85828 Personal history of other malignant neoplasm of skin: Secondary | ICD-10-CM | POA: Diagnosis not present

## 2019-10-05 DIAGNOSIS — L578 Other skin changes due to chronic exposure to nonionizing radiation: Secondary | ICD-10-CM | POA: Diagnosis not present

## 2019-10-05 DIAGNOSIS — L570 Actinic keratosis: Secondary | ICD-10-CM | POA: Diagnosis not present

## 2019-10-08 ENCOUNTER — Other Ambulatory Visit: Payer: Self-pay | Admitting: Family Medicine

## 2019-10-15 ENCOUNTER — Telehealth: Payer: Self-pay

## 2019-10-15 NOTE — Telephone Encounter (Signed)
Patient was advised this medication was no longer on his med list and would need f/u appt for further refills. Pt declined and very upset stating he would call back and hung up the phone.

## 2019-10-15 NOTE — Telephone Encounter (Signed)
Patients wife called in wanting to know why patients medication wasn't refilled. Told patient Dr Lu Duffel not refill it and they requested that a nurse or Dr give a call back explaining why.   sildenafil (REVATIO) 20 MG tablet [Pharmacy Med Name: SILDENAFIL 20MG    [place], [place]    Please call and advise

## 2019-10-19 ENCOUNTER — Ambulatory Visit: Payer: BC Managed Care – PPO | Attending: Internal Medicine

## 2019-10-19 DIAGNOSIS — Z23 Encounter for immunization: Secondary | ICD-10-CM

## 2019-10-19 NOTE — Progress Notes (Signed)
   Covid-19 Vaccination Clinic  Name:  Richard Winters    MRN: [id number] DOB: 1966/01/07  10/19/2019  [name] was observed post Covid-19 immunization for 15 minutes without incident. He was provided with Vaccine Information Sheet and instruction to access the V-Safe system.   [name] was instructed to call 911 with any severe reactions post vaccine: Marland Kitchen Difficulty breathing  . Swelling of face and throat  . A fast heartbeat  . A bad rash all over body  . Dizziness and weakness   Immunizations Administered    Name Date Dose VIS Date Route   Pfizer COVID-19 Vaccine 10/19/2019 11:53 AM 0.3 mL 08/19/2018 Intramuscular   Manufacturer: [place]   Lot: [id number]   [place]: [id number]      Covid-19 Vaccination Clinic  Name:  Richard Winters    MRN: [id number] DOB: 07/03/1965  10/19/2019  [name] was observed post Covid-19 immunization for 15 minutes without incident. He was provided with Vaccine Information Sheet and instruction to access the V-Safe system.   [name] was instructed to call 911 with any severe reactions post vaccine: Marland Kitchen Difficulty breathing  . Swelling of face and throat  . A fast heartbeat  . A bad rash all over body  . Dizziness and weakness   Immunizations Administered    Name Date Dose VIS Date Route   Pfizer COVID-19 Vaccine 10/19/2019 11:53 AM 0.3 mL 08/19/2018 Intramuscular   Manufacturer: [place]   Lot: [id number]   [place]: [id number]

## 2019-10-23 ENCOUNTER — Other Ambulatory Visit: Payer: Self-pay

## 2019-10-28 ENCOUNTER — Ambulatory Visit: Payer: BC Managed Care – PPO | Admitting: Family Medicine

## 2019-10-29 ENCOUNTER — Encounter: Payer: Self-pay | Admitting: Family Medicine

## 2019-10-29 ENCOUNTER — Other Ambulatory Visit: Payer: Self-pay

## 2019-10-29 ENCOUNTER — Ambulatory Visit: Payer: BC Managed Care – PPO | Admitting: Family Medicine

## 2019-10-29 VITALS — BP 148/101 | HR 58 | Temp 98.4°F | Resp 16 | Ht 72.0 in | Wt 204.0 lb

## 2019-10-29 DIAGNOSIS — E8881 Metabolic syndrome: Secondary | ICD-10-CM | POA: Diagnosis not present

## 2019-10-29 DIAGNOSIS — F17200 Nicotine dependence, unspecified, uncomplicated: Secondary | ICD-10-CM

## 2019-10-29 DIAGNOSIS — K219 Gastro-esophageal reflux disease without esophagitis: Secondary | ICD-10-CM

## 2019-10-29 DIAGNOSIS — R030 Elevated blood-pressure reading, without diagnosis of hypertension: Secondary | ICD-10-CM

## 2019-10-29 DIAGNOSIS — N529 Male erectile dysfunction, unspecified: Secondary | ICD-10-CM

## 2019-10-29 MED ORDER — TADALAFIL 20 MG PO TABS: 20.0000 mg | ORAL_TABLET | Freq: Every day | ORAL | 11 refills | Status: DC | PRN

## 2019-10-29 NOTE — Progress Notes (Signed)
OFFICE VISIT  10/29/2019   CC:  Chief Complaint  Patient presents with  . Erectile Dysfunction    Will like prescription for Cialis   HPI:    Patient is a 54 y.o.  male who presents for f/u HTN, metabolic syndrome, and GERD. I last saw him about a year ago.  GERD: has to take omeprazole 40mg  daily or he has breakthrough reflux.  Smoking: about 1 ppd. Not contemplating quitting right now. Some probs at home: mother in law living with him, his father is living with him (has dementia).  BP: says recent bp check at home 120s/80.  Hx of elevated bp at MD visits/DOT physicals but rechecks normal.  Has never had HTN or been on antihypertensives (was on hctz at the time of kidney stone tx).  Diet historically not very good but for the last 6 wks since getting a lot of teeth pulled/dentures he has been eating mostly soups, very soft foods.  Has longterm ED and has done fine with taking several 20mg  sildenafil at a time. However, he wants generic cialis 20mg  so he can get this through [facility] for much less money.  ROS: no fevers, no CP, no SOB, no wheezing, no cough, no dizziness, no HAs, no rashes, no melena/hematochezia.  No polyuria or polydipsia.  No myalgias or arthralgias.  No focal weakness, paresthesias, or tremors.  No acute vision or hearing abnormalities. No n/v/d or abd pain.  No palpitations.     Past Medical History:  Diagnosis Date  . Abdominal lymphadenopathy    CT f/u 66mo later (06/2012) all was stable: interpreted as reactive/post-infectious-no further f/u indicated.  . Chest pain   . Dyslipidemia   . Erosive esophagitis   . GERD (gastroesophageal reflux disease)    s/p EGD 02/2010-erosive esophagitis, no barrett's, H. pylori neg--responded well to PPI  . Hiatal hernia   . Nephrolithiasis 12/2009   with hypercalcuria (treated w/HCTZ)  . Tobacco dependence     Past Surgical History:  Procedure Laterality Date  . BACK SURGERY  age 49 yrs   Lumbar -2 levels (no  hardware)--no signif chronic back problems since  . ESOPHAGOGASTRODUODENOSCOPY  2011   Esophagitis, H. pylori neg, no Barrett's.    Outpatient Medications Prior to Visit  Medication Sig Dispense Refill  . omeprazole (PRILOSEC) 40 MG capsule TAKE 1 CAPSULE BY MOUTH EVERY DAY 90 capsule 3  . amoxicillin (AMOXIL) 875 MG tablet SMARTSIG:1 Tablet(s) By Mouth Every 12 Hours    . HYDROcodone-acetaminophen (NORCO) 7.5-325 MG tablet Take 1 tablet by mouth every 4 (four) hours as needed.     No facility-administered medications prior to visit.    No Known Allergies  ROS As per HPI  PE: Vitals with BMI 10/29/2019 10/29/2019 11/12/2018  Height - 6\' 0"  6\' 0"   Weight - 204 lbs 209 lbs 3 oz  BMI - [id number]  Systolic 123456 [id number]  Diastolic 99991111 92 85  Pulse - 58 77  Repeat bp manually at end of visit today 147/101  Gen: Alert, well appearing.  Patient is oriented to person, place, time, and situation. AFFECT: pleasant, lucid thought and speech. CV: RRR, no m/r/g.   LUNGS: CTA bilat, nonlabored resps, good aeration in all lung fields. EXT: no clubbing or cyanosis.  no edema.    LABS:  Lab Results  Component Value Date   TSH 1.18 06/17/2014   Lab Results  Component Value Date   WBC 7.8 06/17/2014   HGB 16.7 06/17/2014  HCT 50.1 06/17/2014   MCV 87.4 06/17/2014   PLT 253.0 06/17/2014   Lab Results  Component Value Date   CREATININE 0.9 06/17/2014   BUN 14 06/17/2014   NA 139 06/17/2014   K 5.0 06/17/2014   CL 104 06/17/2014   CO2 26 06/17/2014   Lab Results  Component Value Date   ALT 30 06/17/2014   AST 21 06/17/2014   ALKPHOS 75 06/17/2014   BILITOT 1.1 06/17/2014   Lab Results  Component Value Date   CHOL 177 06/17/2014   Lab Results  Component Value Date   HDL 31.00 (L) 06/17/2014   Lab Results  Component Value Date   LDLCALC 120 (H) 06/17/2014   Lab Results  Component Value Date   TRIG 129.0 06/17/2014   Lab Results  Component Value Date   CHOLHDL 6  06/17/2014   Lab Results  Component Value Date   PSA 0.33 01/25/2012   IMPRESSION AND PLAN:  1) Elev bp w/out dx of HTN. Instructions: Check your blood pressure and heart rate at home daily for the next week and write these numbers down. Call and report these to my nurse (or send message in mychart) in 1 wk.  2) Tob dependence: encouraged pt to quit completely. He has too much stress in his life to consider quit attempt at this time.  3) GERD: stable.  Continue daily PPI.  4) ED: will rx cialis 20mg , 1 tab po qd max.  5) Hx of metabolic syndrome: diet and exercise habits need to be worked on.  Pt declined labs today b/c he had to get home to care for his father who has signif dementia.  An After Visit Summary was printed and given to the patient.  FOLLOW UP: Return in about 6 months (around 04/30/2020) for annual CPE (fasting).  Will have him return earlier if we start treating him for HTN.  Signed:  Crissie Sickles, MD           10/29/2019

## 2019-10-29 NOTE — Patient Instructions (Signed)
Check your blood pressure and heart rate at home daily for the next week and write these numbers down. Call and report these to my nurse (or send message in mychart) in 1 wk.

## 2019-11-06 ENCOUNTER — Encounter: Payer: Self-pay | Admitting: Family Medicine

## 2019-11-08 NOTE — Telephone Encounter (Signed)
Average bp is ok. Continue to check bp 1-2 times a week and call for appt if consistently >140 on top or >90 on bottom.-thx

## 2019-12-09 ENCOUNTER — Ambulatory Visit: Payer: BC Managed Care – PPO | Admitting: Family Medicine

## 2019-12-09 ENCOUNTER — Other Ambulatory Visit: Payer: Self-pay

## 2019-12-09 ENCOUNTER — Encounter: Payer: Self-pay | Admitting: Family Medicine

## 2019-12-09 VITALS — BP 110/70 | HR 62 | Temp 98.5°F | Resp 16 | Ht 72.0 in | Wt 200.0 lb

## 2019-12-09 DIAGNOSIS — H6123 Impacted cerumen, bilateral: Secondary | ICD-10-CM | POA: Diagnosis not present

## 2019-12-09 NOTE — Progress Notes (Signed)
OFFICE VISIT  12/09/2019   CC:  Chief Complaint  Patient presents with  . Cerumen impacted    ringing off and on for 5 months along with wax buildup   HPI:    Patient is a 54 y.o. Caucasian male who presents for "ears stopped up and bothering me". Ears feel stuffed up and ringing on and off for a month or so. Not much feeling of hearing being impaired. No drainage from ears.  No pain.  No Q tips.  Past Medical History:  Diagnosis Date  . Abdominal lymphadenopathy    CT f/u 76mo later (06/2012) all was stable: interpreted as reactive/post-infectious-no further f/u indicated.  . Chest pain   . Dyslipidemia   . Erectile dysfunction   . Erosive esophagitis   . GERD (gastroesophageal reflux disease)    s/p EGD 02/2010-erosive esophagitis, no barrett's, H. pylori neg--responded well to PPI  . Hiatal hernia   . Nephrolithiasis 12/2009   with hypercalcuria (treated w/HCTZ)  . Tobacco dependence     Past Surgical History:  Procedure Laterality Date  . BACK SURGERY  age 81 yrs   Lumbar -2 levels (no hardware)--no signif chronic back problems since  . ESOPHAGOGASTRODUODENOSCOPY  2011   Esophagitis, H. pylori neg, no Barrett's.    Outpatient Medications Prior to Visit  Medication Sig Dispense Refill  . omeprazole (PRILOSEC) 40 MG capsule TAKE 1 CAPSULE BY MOUTH EVERY DAY 90 capsule 3  . tadalafil (CIALIS) 20 MG tablet Take 1 tablet (20 mg total) by mouth daily as needed for erectile dysfunction. 20 tablet 11   No facility-administered medications prior to visit.    No Known Allergies  ROS As per HPI  PE: Blood pressure 110/70, pulse 62, temperature 98.5 F (36.9 C), temperature source Temporal, resp. rate 16, height 6' (1.829 m), weight 200 lb (90.7 kg), SpO2 98 %. Gen: Alert, well appearing.  Patient is oriented to person, place, time, and situation. AFFECT: pleasant, lucid thought and speech. Both EACs are completely obstructed by dark brown cerumen.    LABS:     Chemistry      Component Value Date/Time   NA 139 06/17/2014 0909   K 5.0 06/17/2014 0909   CL 104 06/17/2014 0909   CO2 26 06/17/2014 0909   BUN 14 06/17/2014 0909   CREATININE 0.9 06/17/2014 0909      Component Value Date/Time   CALCIUM 9.3 06/17/2014 0909   ALKPHOS 75 06/17/2014 0909   AST 21 06/17/2014 0909   ALT 30 06/17/2014 0909   BILITOT 1.1 06/17/2014 0909     IMPRESSION AND PLAN:  Complete cerumen impaction AU. Too dense to attempt to extract with curette. Nurse irrigated both sides today: cleared 100%, TMs normal. Pt immediately felt great.  An After Visit Summary was printed and given to the patient.  FOLLOW UP: Return if symptoms worsen or fail to improve.  Signed:  Crissie Sickles, MD           12/09/2019

## 2020-01-01 ENCOUNTER — Encounter: Payer: Self-pay | Admitting: Family Medicine

## 2020-01-01 ENCOUNTER — Ambulatory Visit: Payer: BC Managed Care – PPO | Admitting: Family Medicine

## 2020-01-01 ENCOUNTER — Other Ambulatory Visit: Payer: Self-pay

## 2020-01-01 VITALS — BP 139/91 | HR 61 | Temp 97.9°F | Resp 18 | Ht 72.0 in | Wt 198.2 lb

## 2020-01-01 DIAGNOSIS — F17200 Nicotine dependence, unspecified, uncomplicated: Secondary | ICD-10-CM

## 2020-01-01 DIAGNOSIS — K1321 Leukoplakia of oral mucosa, including tongue: Secondary | ICD-10-CM

## 2020-01-01 DIAGNOSIS — E8881 Metabolic syndrome: Secondary | ICD-10-CM

## 2020-01-01 DIAGNOSIS — R030 Elevated blood-pressure reading, without diagnosis of hypertension: Secondary | ICD-10-CM | POA: Diagnosis not present

## 2020-01-01 DIAGNOSIS — E785 Hyperlipidemia, unspecified: Secondary | ICD-10-CM | POA: Diagnosis not present

## 2020-01-01 NOTE — Progress Notes (Signed)
OFFICE VISIT  01/01/2020   CC:  Chief Complaint  Patient presents with  . Hypertension    Not fasting.    HPI:    Patient is a 54 y.o. Caucasian male who presents for f/u elevated bp w/out dx of HTN, metabolic syndrome, and tobacco dependence. A/P as of last visit approx 2 mo ago: ") Elev bp w/out dx of HTN. Instructions: Check your blood pressure and heart rate at home daily for the next week and write these numbers down. Call and report these to my nurse (or send message in mychart) in 1 wk.  2) Tob dependence: encouraged pt to quit completely. He has too much stress in his life to consider quit attempt at this time.  3) GERD: stable.  Continue daily PPI.  4) ED: will rx cialis 20mg , 1 tab po qd max.  5) Hx of metabolic syndrome: diet and exercise habits need to be worked on.  Pt declined labs today b/c he had to get home to care for his father who has signif dementia."  INTERIM HX: Has this week off work. Tough week b/c they just got his father into hospice care. No attempt to quitting smoking. HOme bp monitoring for a week after I last saw him was avg 130s/80.  Some as low as 108 syst and 71 diast.  Highest 137 syst, highest diast 92.  He does not know if he snores.  Wife has not witnessed any apneic events in his sleep.  No morning HAs.  Feels intermittent tongue soreness.  Couple months hx. He is not sure if he sees any abnormality in the area of soreness.  ROS: no fevers, no CP, no SOB, some mild cough and some mild nighttime wheezing the last week or so, no dizziness, no HAs, no rashes, no melena/hematochezia.  No polyuria or polydipsia.  No myalgias or arthralgias.  No focal weakness, paresthesias, or tremors.  No acute vision or hearing abnormalities. No n/v/d or abd pain.  No palpitations.    Past Medical History:  Diagnosis Date  . Abdominal lymphadenopathy    CT f/u 57mo later (06/2012) all was stable: interpreted as reactive/post-infectious-no further f/u  indicated.  . Chest pain   . Dyslipidemia   . Erectile dysfunction   . Erosive esophagitis   . GERD (gastroesophageal reflux disease)    s/p EGD 02/2010-erosive esophagitis, no barrett's, H. pylori neg--responded well to PPI  . Hiatal hernia   . Nephrolithiasis 12/2009   with hypercalcuria (treated w/HCTZ)  . Tobacco dependence     Past Surgical History:  Procedure Laterality Date  . BACK SURGERY  age 60 yrs   Lumbar -2 levels (no hardware)--no signif chronic back problems since  . ESOPHAGOGASTRODUODENOSCOPY  2011   Esophagitis, H. pylori neg, no Barrett's.    Outpatient Medications Prior to Visit  Medication Sig Dispense Refill  . omeprazole (PRILOSEC) 40 MG capsule TAKE 1 CAPSULE BY MOUTH EVERY DAY 90 capsule 3  . tadalafil (CIALIS) 20 MG tablet Take 1 tablet (20 mg total) by mouth daily as needed for erectile dysfunction. 20 tablet 11   No facility-administered medications prior to visit.    No Known Allergies  ROS As per HPI  PE: Vitals with BMI 01/01/2020 12/09/2019 10/29/2019  Height 6\' 0"  6\' 0"  -  Weight 198 lbs 4 oz 200 lbs -  BMI 62.13 08.65 -  Systolic 784 696 295  Diastolic 91 70 284  Pulse 61 62 -  O2 sat on RA today  is 97%  Gen: Alert, well appearing.  Patient is oriented to person, place, time, and situation. AFFECT: pleasant, lucid thought and speech. ORAL exam: upper dentures.  Buccal mucosa and gingiva normal. Midline of tongue about 1/2 way back has 1 cm elliptical shaped area of white discoloration that appears firm---this is non-tender.  LABS:  Lab Results  Component Value Date   TSH 1.18 06/17/2014   Lab Results  Component Value Date   WBC 7.8 06/17/2014   HGB 16.7 06/17/2014   HCT 50.1 06/17/2014   MCV 87.4 06/17/2014   PLT 253.0 06/17/2014   Lab Results  Component Value Date   CREATININE 0.9 06/17/2014   BUN 14 06/17/2014   NA 139 06/17/2014   K 5.0 06/17/2014   CL 104 06/17/2014   CO2 26 06/17/2014   Lab Results  Component Value  Date   ALT 30 06/17/2014   AST 21 06/17/2014   ALKPHOS 75 06/17/2014   BILITOT 1.1 06/17/2014   Lab Results  Component Value Date   CHOL 177 06/17/2014   Lab Results  Component Value Date   HDL 31.00 (L) 06/17/2014   Lab Results  Component Value Date   LDLCALC 120 (H) 06/17/2014   Lab Results  Component Value Date   TRIG 129.0 06/17/2014   Lab Results  Component Value Date   CHOLHDL 6 06/17/2014   Lab Results  Component Value Date   PSA 0.33 01/25/2012    IMPRESSION AND PLAN:  1) Elevated BP: he technically does not fit criteria for HTN dx, but hovers near this. Likely will need to start him on med in near future and he realizes this.    2) Tobacco dependence: not contemplating quit attempt at this time. He is appropriately preoccupied by his father's health situation-->hospice care.  3) Leukoplakia on tongue: Refer to ENT.  Pt higher risk for cancer of oral cavity, tongue, throat, etc due to long time cig smoking.  3) Metabolic syndrome:  IFG, low HDL, elevated bp->we've discussed lifestyle factors playing a role, potential progression to DM 2, HLD, HTN, inc risk cv dz, etc. Not fasting today.  An After Visit Summary was printed and given to the patient.  FOLLOW UP: Return for CPE at his convenience (fasting).  Signed:  Crissie Sickles, MD           01/01/2020

## 2020-01-12 ENCOUNTER — Other Ambulatory Visit: Payer: Self-pay | Admitting: Family Medicine

## 2020-01-13 ENCOUNTER — Encounter: Payer: Self-pay | Admitting: Family Medicine

## 2020-01-29 DIAGNOSIS — R198 Other specified symptoms and signs involving the digestive system and abdomen: Secondary | ICD-10-CM | POA: Diagnosis not present

## 2020-01-29 DIAGNOSIS — F17210 Nicotine dependence, cigarettes, uncomplicated: Secondary | ICD-10-CM | POA: Diagnosis not present

## 2020-01-29 DIAGNOSIS — R0981 Nasal congestion: Secondary | ICD-10-CM | POA: Diagnosis not present

## 2020-04-04 DIAGNOSIS — D1801 Hemangioma of skin and subcutaneous tissue: Secondary | ICD-10-CM | POA: Diagnosis not present

## 2020-04-04 DIAGNOSIS — L570 Actinic keratosis: Secondary | ICD-10-CM | POA: Diagnosis not present

## 2020-04-04 DIAGNOSIS — L814 Other melanin hyperpigmentation: Secondary | ICD-10-CM | POA: Diagnosis not present

## 2020-04-04 DIAGNOSIS — L819 Disorder of pigmentation, unspecified: Secondary | ICD-10-CM | POA: Diagnosis not present

## 2020-04-04 DIAGNOSIS — L905 Scar conditions and fibrosis of skin: Secondary | ICD-10-CM | POA: Diagnosis not present

## 2020-05-13 IMAGING — DX DG CHEST 2V
2 series · 2 of 2 positions shown · non-contrast
Comparison: None.

CLINICAL DATA: Dyspnea.

EXAM:
CHEST - 2 VIEW

[chest pa]
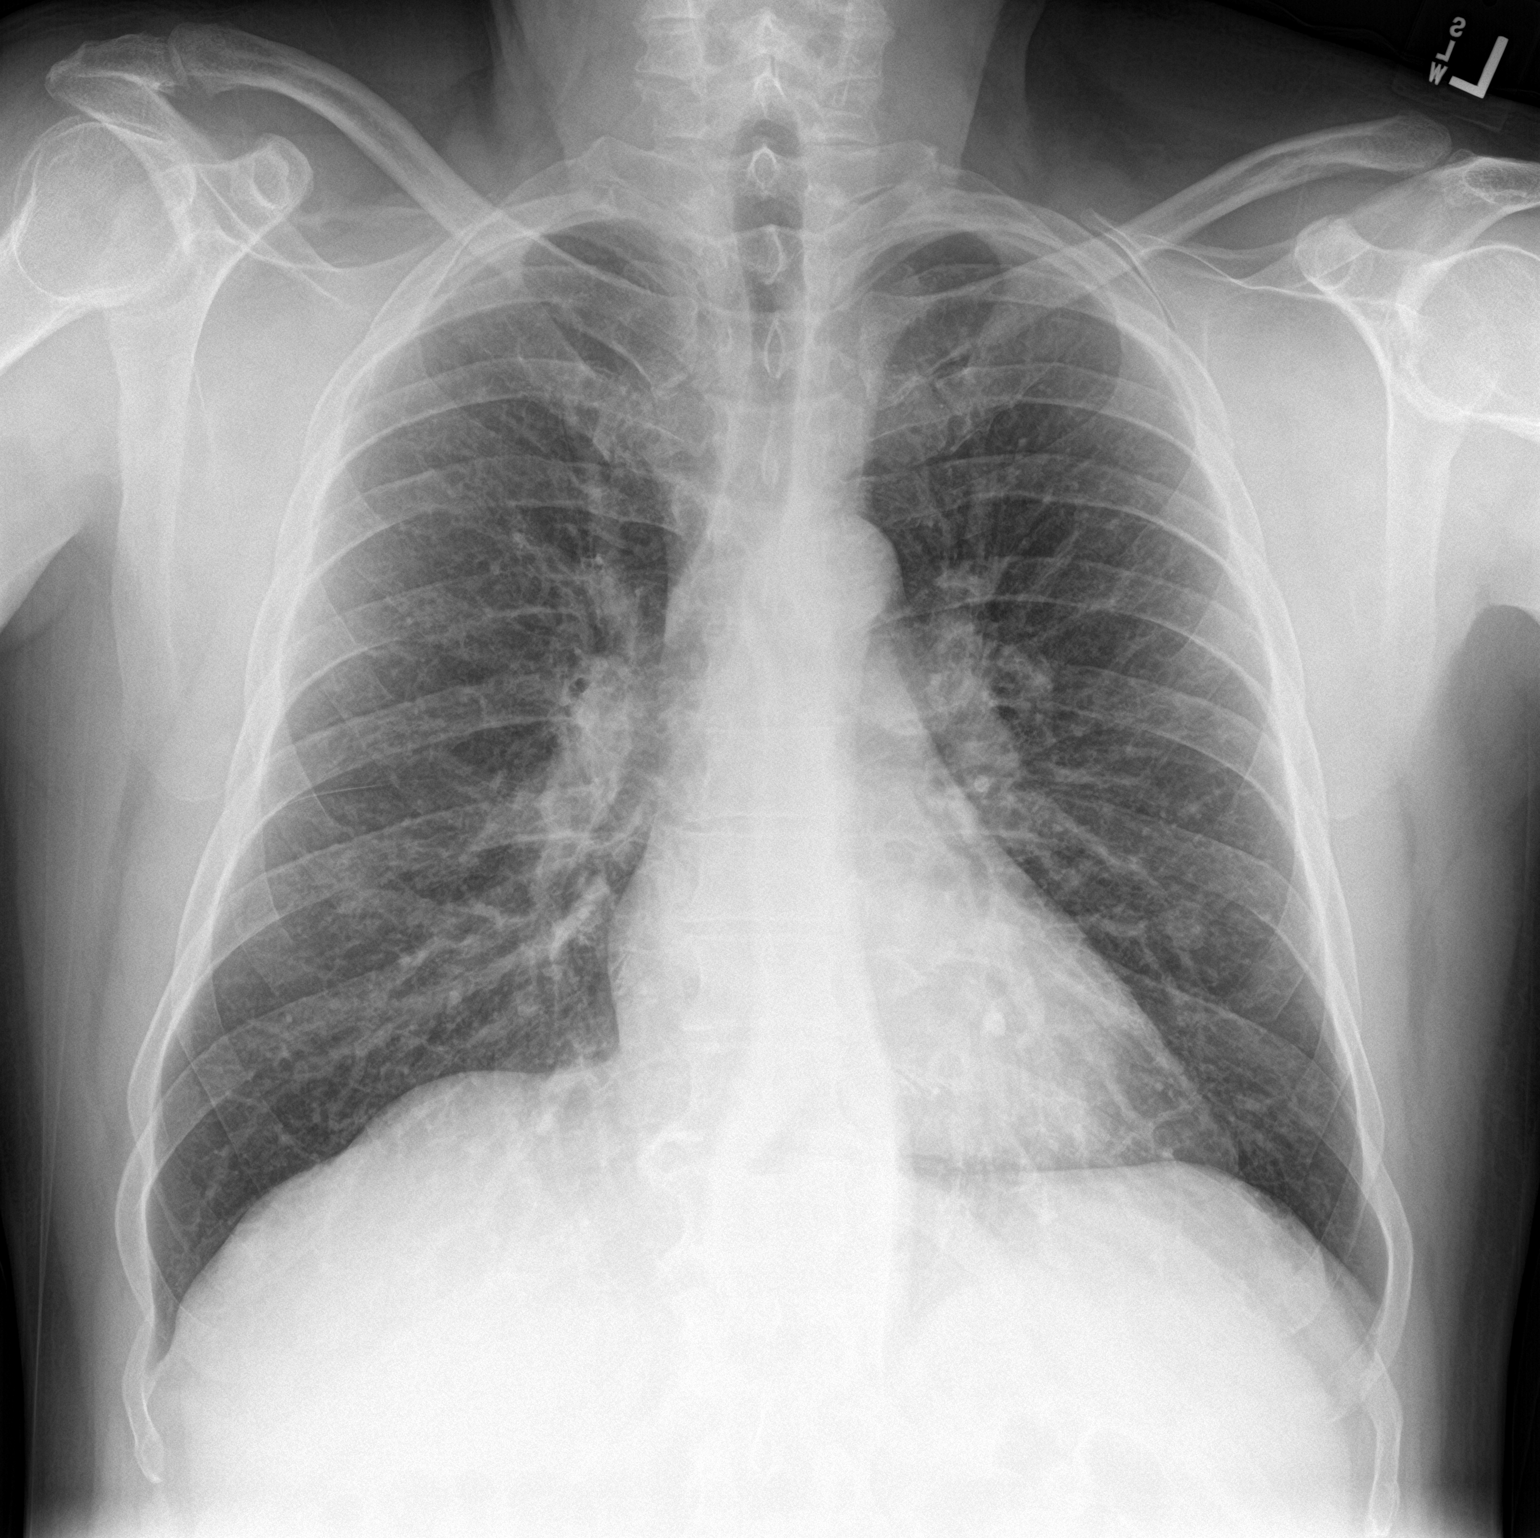

[chest lat]
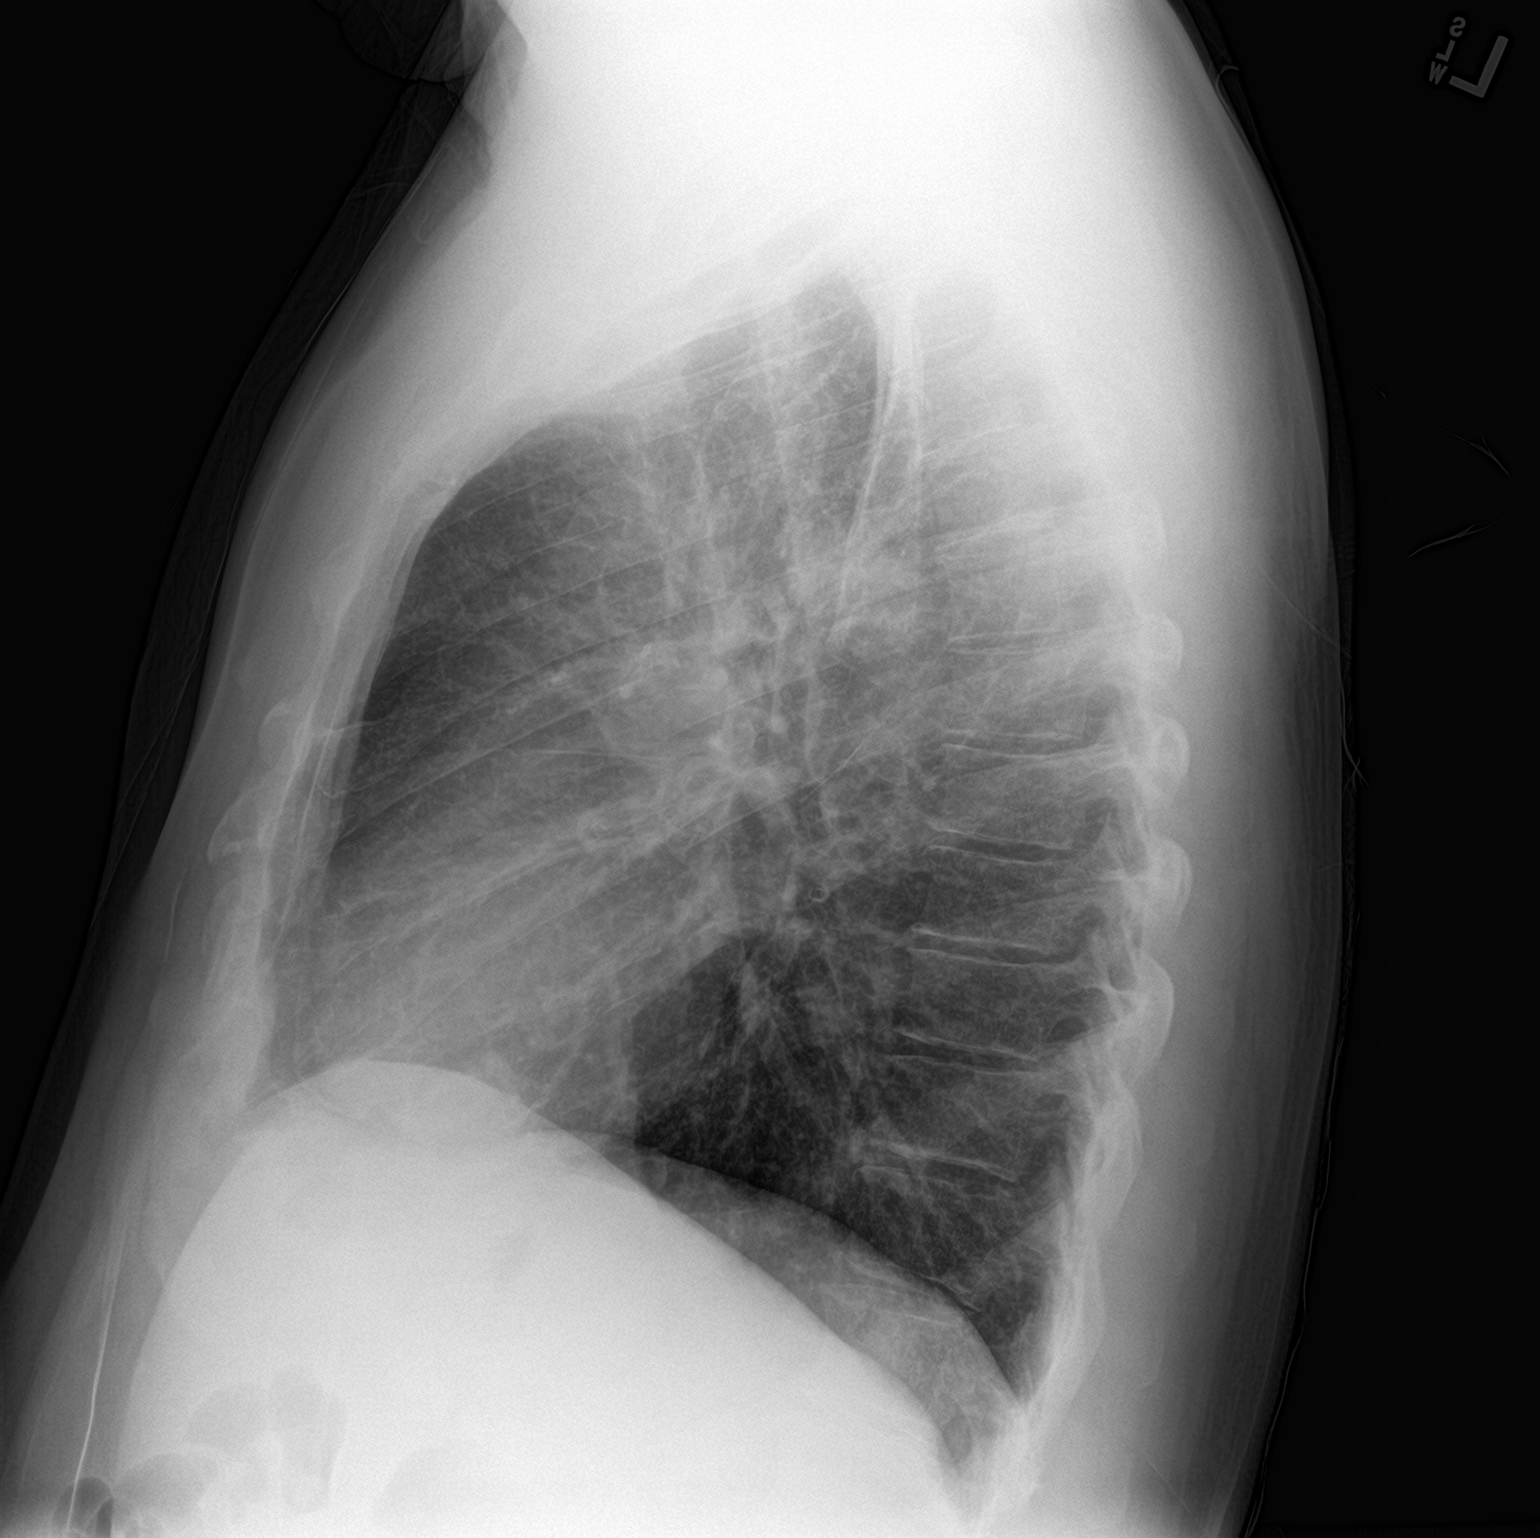

[2 of 2 positions shown; findings below may reference images not displayed]

FINDINGS: The heart size and mediastinal contours are within normal limits.
Both lungs are clear. No pneumothorax or pleural effusion is noted.
The visualized skeletal structures are unremarkable.
IMPRESSION: No active cardiopulmonary disease.

## 2020-07-12 ENCOUNTER — Other Ambulatory Visit: Payer: Self-pay | Admitting: Family Medicine

## 2020-08-04 ENCOUNTER — Other Ambulatory Visit: Payer: Self-pay | Admitting: Family Medicine

## 2020-08-05 ENCOUNTER — Other Ambulatory Visit: Payer: Self-pay | Admitting: Family Medicine

## 2020-08-16 ENCOUNTER — Other Ambulatory Visit: Payer: Self-pay

## 2020-08-17 ENCOUNTER — Ambulatory Visit: Payer: BC Managed Care – PPO | Admitting: Family Medicine

## 2020-08-17 ENCOUNTER — Encounter: Payer: Self-pay | Admitting: Family Medicine

## 2020-08-17 VITALS — BP 117/77 | HR 61 | Temp 97.8°F | Resp 16 | Ht 72.0 in | Wt 200.6 lb

## 2020-08-17 DIAGNOSIS — K219 Gastro-esophageal reflux disease without esophagitis: Secondary | ICD-10-CM | POA: Diagnosis not present

## 2020-08-17 DIAGNOSIS — F17200 Nicotine dependence, unspecified, uncomplicated: Secondary | ICD-10-CM | POA: Diagnosis not present

## 2020-08-17 DIAGNOSIS — E8881 Metabolic syndrome: Secondary | ICD-10-CM | POA: Diagnosis not present

## 2020-08-17 DIAGNOSIS — M25512 Pain in left shoulder: Secondary | ICD-10-CM

## 2020-08-17 DIAGNOSIS — G8929 Other chronic pain: Secondary | ICD-10-CM

## 2020-08-17 MED ORDER — MELOXICAM 15 MG PO TABS
ORAL_TABLET | ORAL | 0 refills | Status: DC
Start: 2020-08-17 — End: 2020-09-12

## 2020-08-17 MED ORDER — OMEPRAZOLE 40 MG PO CPDR: DELAYED_RELEASE_CAPSULE | ORAL | 3 refills | Status: DC

## 2020-08-17 NOTE — Progress Notes (Signed)
OFFICE VISIT  08/17/2020  CC:  Chief Complaint  Patient presents with  . Medication refill    Omeprazole  . Referral for shoulder pain    Left, believes it maybe "wear and tear on rotator cuff". Started 3 months ago, has not taken any otc meds.     HPI:    Patient is a 55 y.o. male who presents for "medication RF and referral for shoulder pain". 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  Points to anterior aspect of shoulder.  Hurts when moving around or when reaching up or out.  Hurts daily.  No sleep impairment.  Occ ibup only when severe, not much at all. No known injury to shoulder.  No weakness or paresthesias in arm.  No neck pain.   Takes omeprazole 40mg  qd, helps well.  If misses a day or two then he feels breakthrough reflux. No dysphagia or odynophagia.  Still smoking, not contemplating a quit attempt at this time. Has not tried quitting in a few years.     Past Medical History:  Diagnosis Date  . Abdominal lymphadenopathy    CT f/u 23mo later (06/2012) all was stable: interpreted as reactive/post-infectious-no further f/u indicated.  . Chest pain   . Dyslipidemia   . Erectile dysfunction   . Erosive esophagitis   . GERD (gastroesophageal reflux disease)    s/p EGD 02/2010-erosive esophagitis, no barrett's, H. pylori neg--responded well to PPI  . Hiatal hernia   . Nephrolithiasis 12/2009   with hypercalcuria (treated w/HCTZ)  . Tobacco dependence     Past Surgical History:  Procedure Laterality Date  . BACK SURGERY  age 60 yrs   Lumbar -2 levels (no hardware)--no signif chronic back problems since  . ESOPHAGOGASTRODUODENOSCOPY  2011   Esophagitis, H. pylori neg, no Barrett's.   Social History   Socioeconomic History  . Marital status: Married    Spouse name: Not on file  . Number of children: Not on file  . Years of education: Not on file  . Highest education level: Not on file   Occupational History  . Not on file  Tobacco Use  . Smoking status: Current Every Day Smoker  . Smokeless tobacco: Never Used  Substance and Sexual Activity  . Alcohol use: Yes  . Drug use: No  . Sexual activity: Not on file  Other Topics Concern  . Not on file  Social History Narrative   Married, 1 son and 1 daughter.   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   +cigarettes: 30 pack-yr hx.   Alcohol: occasional beer, rarely whisky.   No drugs.  Occ rides 4 wheelers.   Orig from [place] and went to [employer].   Social Determinants of Health   Financial Resource Strain: Not on file  Food Insecurity: Not on file  Transportation Needs: Not on file  Physical Activity: Not on file  Stress: Not on file  Social Connections: Not on file    Outpatient Medications Prior to Visit  Medication Sig Dispense Refill  . tadalafil (CIALIS) 20 MG tablet Take 1 tablet (20 mg total) by mouth daily as needed for erectile dysfunction. 20 tablet 11  . omeprazole (PRILOSEC) 40 MG capsule TAKE 1 CAPSULE BY MOUTH EVERY DAY 30 capsule 0   No facility-administered medications prior to visit.    No Known Allergies  ROS As per HPI  PE: Vitals with BMI  08/17/2020 01/01/2020 12/09/2019  Height 6\' 0"  6\' 0"  6\' 0"   Weight 200 lbs 10 oz 198 lbs 4 oz 200 lbs  BMI 27.2 56.81 27.51  Systolic 700 174 944  Diastolic 77 91 70  Pulse 61 61 62    Gen: Alert, well appearing.  Patient is oriented to person, place, time, and situation. AFFECT: pleasant, lucid thought and speech. Neck: rom fully intact and w/out TTP. No Tenderness anywhere in bony or soft tissues aspects of L shoulder girdle except fairly focal area over coracoid process.  No TTP over AC joint.  Speed's and Yergason's neg. Supraspinatus test neg.  ER and IR fully intact w/out signif pain.  Neg impingement sign. UE strength 5/5 prox/dist bilat. CV: RRR, no m/r/g.   LUNGS: CTA bilat, nonlabored resps, good aeration in all lung  fields. EXT: no clubbing or cyanosis.  no edema.     LABS:    Chemistry      Component Value Date/Time   NA 139 06/17/2014 0909   K 5.0 06/17/2014 0909   CL 104 06/17/2014 0909   CO2 26 06/17/2014 0909   BUN 14 06/17/2014 0909   CREATININE 0.9 06/17/2014 0909      Component Value Date/Time   CALCIUM 9.3 06/17/2014 0909   ALKPHOS 75 06/17/2014 0909   AST 21 06/17/2014 0909   ALT 30 06/17/2014 0909   BILITOT 1.1 06/17/2014 0909     Lab Results  Component Value Date   WBC 7.8 06/17/2014   HGB 16.7 06/17/2014   HCT 50.1 06/17/2014   MCV 87.4 06/17/2014   PLT 253.0 06/17/2014   Lab Results  Component Value Date   CHOL 177 06/17/2014   HDL 31.00 (L) 06/17/2014   LDLCALC 120 (H) 06/17/2014   TRIG 129.0 06/17/2014   CHOLHDL 6 06/17/2014   Lab Results  Component Value Date   TSH 1.18 06/17/2014   IMPRESSION AND PLAN:  1) GERD: stable but requiring daily PPI. Continue omeprazole 40mg  qd, RFs given today.  2) Left shoulder pain, subacute. Question osteoarthritis vs inflamm/strain of tendon inserting on coracoid process.  Exam not c/w RC or labrum etiology.   Plan: meloxicam 15mg  qd x 10d, then qd prn-->#30, no RF. Refer to ortho ordered today.  3) Metabolic syndrome, tob dependence: encouraged better diet/exercise and encouraged smoking cessation.  He is past due for some fasting blood work and states he'll be making this a priority after he gets shoulder issue straightened out some.  An After Visit Summary was printed and given to the patient.  FOLLOW UP: Return for as needed.  Signed:  Crissie Sickles, MD           08/17/2020

## 2020-08-22 DIAGNOSIS — M7582 Other shoulder lesions, left shoulder: Secondary | ICD-10-CM | POA: Diagnosis not present

## 2020-09-10 ENCOUNTER — Other Ambulatory Visit: Payer: Self-pay | Admitting: Family Medicine

## 2020-09-12 ENCOUNTER — Encounter: Payer: Self-pay | Admitting: Family Medicine

## 2020-09-12 NOTE — Telephone Encounter (Signed)
Patient was to go to ortho at visit last month at [facility].  Do you want them to refill if he went.  Your last assessment: 2) Left shoulder pain, subacute. Question osteoarthritis vs inflamm/strain of tendon inserting on coracoid process.  Exam not c/w RC or labrum etiology.   Plan: meloxicam 15mg  qd x 10d, then qd prn-->#30, no RF. Refer to ortho ordered today.

## 2020-09-12 NOTE — Telephone Encounter (Signed)
Pt has not had appt with Dr.Perry since 03/15/2010 for endoscopy. New referral needed?

## 2020-09-12 NOTE — Telephone Encounter (Signed)
He should be able to call them and schedule w/out a new referral.

## 2020-11-08 ENCOUNTER — Other Ambulatory Visit: Payer: Self-pay | Admitting: Family Medicine

## 2020-12-05 DIAGNOSIS — S83241A Other tear of medial meniscus, current injury, right knee, initial encounter: Secondary | ICD-10-CM | POA: Diagnosis not present

## 2021-02-20 ENCOUNTER — Other Ambulatory Visit: Payer: Self-pay

## 2021-02-20 ENCOUNTER — Ambulatory Visit (AMBULATORY_SURGERY_CENTER): Payer: BC Managed Care – PPO | Admitting: *Deleted

## 2021-02-20 VITALS — Ht 73.0 in | Wt 195.0 lb

## 2021-02-20 DIAGNOSIS — Z1211 Encounter for screening for malignant neoplasm of colon: Secondary | ICD-10-CM

## 2021-02-20 MED ORDER — PLENVU 140 G PO SOLR: 1.0000 | Freq: Once | ORAL | 0 refills | Status: AC

## 2021-02-20 NOTE — Progress Notes (Signed)
Virtual pre-visit completed over telephone.    Instructions sent through MyChart and e-mail [email or web address]    No egg or soy allergy known to patient  No issues with past sedation with any surgeries or procedures Patient denies ever being told they had issues or difficulty with intubation  No FH of Malignant Hyperthermia No diet pills per patient No home 02 use per patient  No blood thinners per patient  Pt denies issues with constipation  No A fib or A flutter  EMMI video to pt or via [place] 19 guidelines implemented in [place] today with Pt and RN   Pt is fully vaccinated  for Covid    Due to the COVID-19 pandemic we are asking patients to follow certain guidelines.  Pt aware of COVID protocols and [facility] guidelines

## 2021-03-06 ENCOUNTER — Encounter: Payer: Self-pay | Admitting: Internal Medicine

## 2021-03-06 ENCOUNTER — Other Ambulatory Visit: Payer: Self-pay

## 2021-03-06 ENCOUNTER — Ambulatory Visit (AMBULATORY_SURGERY_CENTER): Payer: BC Managed Care – PPO | Admitting: Internal Medicine

## 2021-03-06 VITALS — BP 109/79 | HR 55 | Temp 97.8°F | Resp 17 | Ht 73.0 in | Wt 195.0 lb

## 2021-03-06 DIAGNOSIS — D120 Benign neoplasm of cecum: Secondary | ICD-10-CM

## 2021-03-06 DIAGNOSIS — D125 Benign neoplasm of sigmoid colon: Secondary | ICD-10-CM | POA: Diagnosis not present

## 2021-03-06 DIAGNOSIS — Z1211 Encounter for screening for malignant neoplasm of colon: Secondary | ICD-10-CM | POA: Diagnosis not present

## 2021-03-06 DIAGNOSIS — D124 Benign neoplasm of descending colon: Secondary | ICD-10-CM

## 2021-03-06 DIAGNOSIS — K635 Polyp of colon: Secondary | ICD-10-CM | POA: Diagnosis not present

## 2021-03-06 MED ORDER — SODIUM CHLORIDE 0.9 % IV SOLN: 500.0000 mL | Freq: Once | INTRAVENOUS | Status: DC

## 2021-03-06 NOTE — Progress Notes (Signed)
Called to room to assist during endoscopic procedure.  Patient ID and intended procedure confirmed with present staff. Received instructions for my participation in the procedure from the performing physician.  

## 2021-03-06 NOTE — Progress Notes (Signed)
Sedate, gd SR, tolerated procedure well, VSS, report to RN 

## 2021-03-06 NOTE — Progress Notes (Signed)
Pt. Reports no change in his medical or surgical history since his pre-visit 02/20/2021.

## 2021-03-06 NOTE — Patient Instructions (Signed)
Handout given:  polyps Resume previous diet Continue current medications Await pathology results  YOU HAD AN ENDOSCOPIC PROCEDURE TODAY AT [facility]:   Refer to the procedure report that was given to you for any specific questions about what was found during the examination.  If the procedure report does not answer your questions, please call your gastroenterologist to clarify.  If you requested that your care partner not be given the details of your procedure findings, then the procedure report has been included in a sealed envelope for you to review at your convenience later.  YOU SHOULD EXPECT: Some feelings of bloating in the abdomen. Passage of more gas than usual.  Walking can help get rid of the air that was put into your GI tract during the procedure and reduce the bloating. If you had a lower endoscopy (such as a colonoscopy or flexible sigmoidoscopy) you may notice spotting of blood in your stool or on the toilet paper. If you underwent a bowel prep for your procedure, you may not have a normal bowel movement for a few days.  Please Note:  You might notice some irritation and congestion in your nose or some drainage.  This is from the oxygen used during your procedure.  There is no need for concern and it should clear up in a day or so.  SYMPTOMS TO REPORT IMMEDIATELY:  Following lower endoscopy (colonoscopy or flexible sigmoidoscopy):  Excessive amounts of blood in the stool  Significant tenderness or worsening of abdominal pains  Swelling of the abdomen that is new, acute  Fever of 100F or higher  For urgent or emergent issues, a gastroenterologist can be reached at any hour by calling [phone]. Do not use MyChart messaging for urgent concerns.   DIET:  We do recommend a small meal at first, but then you may proceed to your regular diet.  Drink plenty of fluids but you should avoid alcoholic beverages for 24 hours.  ACTIVITY:  You should plan to take it  easy for the rest of today and you should NOT DRIVE or use heavy machinery until tomorrow (because of the sedation medicines used during the test).    FOLLOW UP: Our staff will call the number listed on your records 48-72 hours following your procedure to check on you and address any questions or concerns that you may have regarding the information given to you following your procedure. If we do not reach you, we will leave a message.  We will attempt to reach you two times.  During this call, we will ask if you have developed any symptoms of COVID 19. If you develop any symptoms (ie: fever, flu-like symptoms, shortness of breath, cough etc.) before then, please call [phone].  If you test positive for Covid 19 in the 2 weeks post procedure, please call and report this information to [place].    If any biopsies were taken you will be contacted by phone or by letter within the next 1-3 weeks.  Please call us at [phone] if you have not heard about the biopsies in 3 weeks.   SIGNATURES/CONFIDENTIALITY: You and/or your care partner have signed paperwork which will be entered into your electronic medical record.  These signatures attest to the fact that that the information above on your After Visit Summary has been reviewed and is understood.  Full responsibility of the confidentiality of this discharge information lies with you and/or your care-partner.

## 2021-03-06 NOTE — Op Note (Signed)
[facility] Patient Name: Richard Winters Procedure Date: 03/06/2021 10:49 AM MRN: [id number] Endoscopist: Docia Chuck. Henrene Pastor , MD Age: 55 Referring MD:  Date of Birth: [birth year] Gender: Male Account #: [id number] Procedure:                Colonoscopy with cold snare polypectomy x 3; with                            biopsy Indications:              Screening for colorectal malignant neoplasm Medicines:                Monitored Anesthesia Care Procedure:                Pre-Anesthesia Assessment:                           - Prior to the procedure, a History and Physical                            was performed, and patient medications and                            allergies were reviewed. The patient's tolerance of                            previous anesthesia was also reviewed. The risks                            and benefits of the procedure and the sedation                            options and risks were discussed with the patient.                            All questions were answered, and informed consent                            was obtained. Prior Anticoagulants: The patient has                            taken no previous anticoagulant or antiplatelet                            agents. ASA Grade Assessment: II - A patient with                            mild systemic disease. After reviewing the risks                            and benefits, the patient was deemed in                            satisfactory condition to undergo the procedure.  After obtaining informed consent, the colonoscope                            was passed under direct vision. Throughout the                            procedure, the patient's blood pressure, pulse, and                            oxygen saturations were monitored continuously. The                            Olympus CF-HQ190L [id number]) Colonoscope was                            introduced through the anus  and advanced to the the                            cecum, identified by appendiceal orifice and                            ileocecal valve. The ileocecal valve, appendiceal                            orifice, and rectum were photographed. The quality                            of the bowel preparation was excellent. The                            colonoscopy was performed without difficulty. The                            patient tolerated the procedure well. The bowel                            preparation used was SUPREP via split dose                            instruction. Scope In: 10:59:45 AM Scope Out: 11:14:24 AM Scope Withdrawal Time: 0 hours 11 minutes 42 seconds  Total Procedure Duration: 0 hours 14 minutes 39 seconds  Findings:                 Three polyps were found in the sigmoid colon,                            descending colon and cecum. The polyps were 3 to 5                            mm in size. These polyps were removed with a cold                            snare. Resection and retrieval were complete.  One less than 1 mm mucosal nodule was found in the                            cecum. Biopsies were taken with a cold forceps for                            histology.                           The exam was otherwise without abnormality on                            direct and retroflexion views. Complications:            No immediate complications. Estimated blood loss:                            None. Estimated Blood Loss:     Estimated blood loss: none. Impression:               - Three 3 to 5 mm polyps in the sigmoid colon, in                            the descending colon and in the cecum, removed with                            a cold snare. Resected and retrieved.                           - Mucosal nodule in the cecum. Biopsied.                           - The examination was otherwise normal on direct                            and  retroflexion views. Recommendation:           - Repeat colonoscopy in 5 years for surveillance.                           - Patient has a contact number available for                            emergencies. The signs and symptoms of potential                            delayed complications were discussed with the                            patient. Return to normal activities tomorrow.                            Written discharge instructions were provided to the  patient.                           - Resume previous diet.                           - Continue present medications.                           - Await pathology results. Docia Chuck. Henrene Pastor, MD 03/06/2021 11:20:13 AM This report has been signed electronically.

## 2021-03-06 NOTE — Progress Notes (Signed)
HISTORY OF PRESENT ILLNESS:  Richard Winters is a 55 y.o. male who presents for routine screening colonoscopy.  No active medical problems or significant complaints.  REVIEW OF SYSTEMS:  All non-GI ROS negative. Past Medical History:  Diagnosis Date   Abdominal lymphadenopathy    CT f/u 55molater (06/2012) all was stable: interpreted as reactive/post-infectious-no further f/u indicated.   Asthma    Chest pain    Dyslipidemia    Erectile dysfunction    Erosive esophagitis    GERD (gastroesophageal reflux disease)    s/p EGD 02/2010-erosive esophagitis, no barrett's, H. pylori neg--responded well to PPI   Hiatal hernia    Nephrolithiasis 12/2009   with hypercalcuria (treated w/HCTZ)   Tobacco dependence     Past Surgical History:  Procedure Laterality Date   BACK SURGERY  age 89yrs   Lumbar -2 levels (no hardware)--no signif chronic back problems since   ESOPHAGOGASTRODUODENOSCOPY  06/25/2009   Esophagitis, H. pylori neg, no Barrett's.    Social History Richard Winters reports that he has been smoking cigarettes. He has never used smokeless tobacco. He reports current alcohol use. He reports that he does not use drugs.  family history includes Diabetes in his maternal grandfather and maternal grandmother; Heart attack in his father; Hypertension in his father.  No Known Allergies     PHYSICAL EXAMINATION:  Vital signs: BP 99/62   Pulse 63   Temp 97.8 F (36.6 C)   Ht '6\' 1"'$  (1.854 m)   Wt 195 lb (88.5 kg)   SpO2 96%   BMI 25.73 kg/m  General: Well-developed, well-nourished, no acute distress HEENT: Sclerae are anicteric, conjunctiva pink. Oral mucosa intact Lungs: Clear Heart: Regular Abdomen: soft, nontender, nondistended, no obvious ascites, no peritoneal signs, normal bowel sounds. No organomegaly. Extremities: No edema Psychiatric: alert and oriented x3. Cooperative     ASSESSMENT:  1.  Colon cancer screening.  Average risk   PLAN:  1.   Colonoscopy

## 2021-03-08 ENCOUNTER — Telehealth: Payer: Self-pay | Admitting: *Deleted

## 2021-03-08 ENCOUNTER — Encounter: Payer: Self-pay | Admitting: Internal Medicine

## 2021-03-08 ENCOUNTER — Telehealth: Payer: Self-pay

## 2021-03-08 NOTE — Telephone Encounter (Signed)
  Follow up Call-  Call back number 03/06/2021  Post procedure Call Back phone  # [phone]   (Pt. works night shift, will be asleep at initial call)  Permission to leave phone message Yes  Some recent data might be hidden     Patient questions:  Do you have a fever, pain , or abdominal swelling? No. Pain Score  0 *  Have you tolerated food without any problems? Yes.    Have you been able to return to your normal activities? Yes.    Do you have any questions about your discharge instructions: Diet   No. Medications  No. Follow up visit  No.  Do you have questions or concerns about your Care? No.  Actions: * If pain score is 4 or above: No action needed, pain <4. Have you developed a fever since your procedure? no  2.   Have you had an respiratory symptoms (SOB or cough) since your procedure? no  3.   Have you tested positive for COVID 19 since your procedure no  4.   Have you had any family members/close contacts diagnosed with the COVID 19 since your procedure?  no   If yes to any of these questions please route to Joylene John, RN and Joella Prince, RN

## 2021-03-08 NOTE — Telephone Encounter (Signed)
First follow up call attempt.  No answer.

## 2021-08-09 ENCOUNTER — Other Ambulatory Visit: Payer: Self-pay | Admitting: Family Medicine

## 2022-05-10 ENCOUNTER — Telehealth: Payer: Self-pay

## 2022-05-10 NOTE — Telephone Encounter (Signed)
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 Additional Comment Caller just wants the office to know that his appt is for a ear cleaning and blood work. Disp. Time Disposition Final User 05/10/2022 12:02:48 PM General Information Provided Yes Phoebe Perch Call Closed By: Phoebe Perch Transaction Date/Time: 05/10/2022 11:59:24 AM (ET)

## 2022-05-14 ENCOUNTER — Encounter: Payer: Self-pay | Admitting: Family Medicine

## 2022-05-14 ENCOUNTER — Ambulatory Visit: Payer: BC Managed Care – PPO | Admitting: Family Medicine

## 2022-05-14 VITALS — BP 115/73 | HR 57 | Temp 97.8°F | Ht 73.0 in | Wt 201.8 lb

## 2022-05-14 DIAGNOSIS — Z125 Encounter for screening for malignant neoplasm of prostate: Secondary | ICD-10-CM

## 2022-05-14 DIAGNOSIS — Z0000 Encounter for general adult medical examination without abnormal findings: Secondary | ICD-10-CM

## 2022-05-14 LAB — PSA: PSA: 0.18 ng/mL (ref 0.10–4.00)

## 2022-05-14 LAB — TSH: TSH: 0.87 u[IU]/mL (ref 0.35–5.50)

## 2022-05-14 LAB — CBC
HCT: 49.9 % (ref 39.0–52.0)
Hemoglobin: 16.9 g/dL (ref 13.0–17.0)
MCHC: 33.8 g/dL (ref 30.0–36.0)
MCV: 88.9 fl (ref 78.0–100.0)
Platelets: 233 10*3/uL (ref 150.0–400.0)
RBC: 5.62 Mil/uL (ref 4.22–5.81)
RDW: 14 % (ref 11.5–15.5)
WBC: 7.4 10*3/uL (ref 4.0–10.5)

## 2022-05-14 NOTE — Patient Instructions (Signed)
Health Maintenance, Male Adopting a healthy lifestyle and getting preventive care are important in promoting health and wellness. Ask your health care provider about: The right schedule for you to have regular tests and exams. Things you can do on your own to prevent diseases and keep yourself healthy. What should I know about diet, weight, and exercise? Eat a healthy diet  Eat a diet that includes plenty of vegetables, fruits, low-fat dairy products, and lean protein. Do not eat a lot of foods that are high in solid fats, added sugars, or sodium. Maintain a healthy weight Body mass index (BMI) is a measurement that can be used to identify possible weight problems. It estimates body fat based on height and weight. Your health care provider can help determine your BMI and help you achieve or maintain a healthy weight. Get regular exercise Get regular exercise. This is one of the most important things you can do for your health. Most adults should: Exercise for at least 150 minutes each week. The exercise should increase your heart rate and make you sweat (moderate-intensity exercise). Do strengthening exercises at least twice a week. This is in addition to the moderate-intensity exercise. Spend less time sitting. Even light physical activity can be beneficial. Watch cholesterol and blood lipids Have your blood tested for lipids and cholesterol at 55 years of age, then have this test every 5 years. You may need to have your cholesterol levels checked more often if: Your lipid or cholesterol levels are high. You are older than 55 years of age. You are at high risk for heart disease. What should I know about cancer screening? Many types of cancers can be detected early and may often be prevented. Depending on your health history and family history, you may need to have cancer screening at various ages. This may include screening for: Colorectal cancer. Prostate cancer. Skin cancer. Lung  cancer. What should I know about heart disease, diabetes, and high blood pressure? Blood pressure and heart disease High blood pressure causes heart disease and increases the risk of stroke. This is more likely to develop in people who have high blood pressure readings or are overweight. Talk with your health care provider about your target blood pressure readings. Have your blood pressure checked: Every 3-5 years if you are 18-39 years of age. Every year if you are 40 years old or older. If you are between the ages of 65 and 75 and are a current or former smoker, ask your health care provider if you should have a one-time screening for abdominal aortic aneurysm (AAA). Diabetes Have regular diabetes screenings. This checks your fasting blood sugar level. Have the screening done: Once every three years after age 45 if you are at a normal weight and have a low risk for diabetes. More often and at a younger age if you are overweight or have a high risk for diabetes. What should I know about preventing infection? Hepatitis B If you have a higher risk for hepatitis B, you should be screened for this virus. Talk with your health care provider to find out if you are at risk for hepatitis B infection. Hepatitis C Blood testing is recommended for: Everyone born from 1945 through 1965. Anyone with known risk factors for hepatitis C. Sexually transmitted infections (STIs) You should be screened each year for STIs, including gonorrhea and chlamydia, if: You are sexually active and are younger than 55 years of age. You are older than 55 years of age and your   health care provider tells you that you are at risk for this type of infection. Your sexual activity has changed since you were last screened, and you are at increased risk for chlamydia or gonorrhea. Ask your health care provider if you are at risk. Ask your health care provider about whether you are at high risk for HIV. Your health care provider  may recommend a prescription medicine to help prevent HIV infection. If you choose to take medicine to prevent HIV, you should first get tested for HIV. You should then be tested every 3 months for as long as you are taking the medicine. Follow these instructions at home: Alcohol use Do not drink alcohol if your health care provider tells you not to drink. If you drink alcohol: Limit how much you have to 0-2 drinks a day. Know how much alcohol is in your drink. In the [place] one drink equals one 12 oz bottle of beer (355 mL), one 5 oz glass of wine (148 mL), or one 1 oz glass of hard liquor (44 mL). Lifestyle Do not use any products that contain nicotine or tobacco. These products include cigarettes, chewing tobacco, and vaping devices, such as e-cigarettes. If you need help quitting, ask your health care provider. Do not use street drugs. Do not share needles. Ask your health care provider for help if you need support or information about quitting drugs. General instructions Schedule regular health, dental, and eye exams. Stay current with your vaccines. Tell your health care provider if: You often feel depressed. You have ever been abused or do not feel safe at home. Summary Adopting a healthy lifestyle and getting preventive care are important in promoting health and wellness. Follow your health care provider's instructions about healthy diet, exercising, and getting tested or screened for diseases. Follow your health care provider's instructions on monitoring your cholesterol and blood pressure. This information is not intended to replace advice given to you by your health care provider. Make sure you discuss any questions you have with your health care provider. Document Revised: 10/31/2020 Document Reviewed: 10/31/2020 Elsevier Patient Education  2023 Elsevier Inc.  

## 2022-05-14 NOTE — Progress Notes (Signed)
Office Note 05/14/2022  CC:  Chief Complaint  Patient presents with   Annual Exam    HPI:  Patient is a 56 y.o. male who is here for annual health maintenance exam.  Richard Winters feels well. 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 He does still smoke cigarettes.  He has no acute concerns today.  Past Medical History:  Diagnosis Date   Abdominal lymphadenopathy    CT f/u 7molater (06/2012) all was stable: interpreted as reactive/post-infectious-no further f/u indicated.   Asthma    Chest pain    Dyslipidemia    Erectile dysfunction    Erosive esophagitis    GERD (gastroesophageal reflux disease)    s/p EGD 02/2010-erosive esophagitis, no barrett's, H. pylori neg--responded well to PPI   Hiatal hernia    History of adenomatous polyp of colon    02/2021, recall 2027   Nephrolithiasis 12/2009   with hypercalcuria (treated w/HCTZ)   Tobacco dependence     Past Surgical History:  Procedure Laterality Date   BACK SURGERY  age [age]yrs   Lumbar -2 levels (no hardware)--no signif chronic back problems since   COLONOSCOPY     03/06/21 polypsx 3, recall 02/2026   ESOPHAGOGASTRODUODENOSCOPY  06/25/2009   Esophagitis, H. pylori neg, no Barrett's.    Family History  Problem Relation Age of Onset   Heart attack Father    Hypertension Father    Diabetes Maternal Grandmother    Diabetes Maternal Grandfather    Colon cancer Neg Hx    Colon polyps Neg Hx    Esophageal cancer Neg Hx    Stomach cancer Neg Hx     Social History   Socioeconomic History   Marital status: Married    Spouse name: Not on file   Number of children: Not on file   Years of education: Not on file   Highest education level: 12th grade  Occupational History   Not on file  Tobacco Use   Smoking status: Every Day    Types: Cigarettes   Smokeless tobacco: Never  Substance and Sexual Activity   Alcohol use: Yes    Comment: rare   Drug use: No   Sexual activity: Not on file   Other Topics Concern   Not on file  Social History Narrative   Married, 1 son and 1 daughter.   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   +cigarettes: 30 pack-yr hx.   Alcohol: occasional beer, rarely whisky.   No drugs.  Occ rides 4 wheelers.   [place] [employer]   Social Determinants of Health   Financial Resource Strain: Low Risk  (05/10/2022)   Overall Financial Resource Strain (CARDIA)    Difficulty of Paying Living Expenses: Not hard at all  Food Insecurity: No Food Insecurity (05/10/2022)   Hunger Vital Sign    Worried About Running Out of Food in the Last Year: Never true    Ran Out of Food in the Last Year: Never true  Transportation Needs: No Transportation Needs (05/10/2022)   PRAPARE - THydrologist(Medical): No    Lack of Transportation (Non-Medical): No  Physical Activity: Unknown (05/10/2022)   Exercise Vital Sign    Days of Exercise per Week: 2 days    Minutes of Exercise per Session: Not on file  Stress: No Stress Concern Present (05/10/2022)   FDanielson  Feeling of Stress : Not at all  Social Connections: Moderately Integrated (05/10/2022)   Social Connection and Isolation Panel [NHANES]    Frequency of Communication with Friends and Family: More than three times a week    Frequency of Social Gatherings with Friends and Family: Once a week    Attends Religious Services: 1 to 4 times per year    Active Member of [employer] or Organizations: No    Attends Music therapist: Not on file    Marital Status: Married  Human resources officer Violence: Not on file    Outpatient Medications Prior to Visit  Medication Sig Dispense Refill   omeprazole (PRILOSEC) 40 MG capsule TAKE 1 CAPSULE BY MOUTH EVERY DAY 90 capsule 1   tadalafil (CIALIS) 20 MG tablet TAKE 1 TABLET (20 MG TOTAL) BY MOUTH DAILY AS NEEDED DOR ERECTILE DYSFUNCTION  20 tablet 5   meloxicam (MOBIC) 15 MG tablet TAKE 1 TABLET BY MOUTH DAILY WITH FOOD FOR 10 DAYS THEN TAKE 1 AS NEEDED FOR SHOULDER PAIN (Patient not taking: Reported on 03/06/2021) 30 tablet 0   No facility-administered medications prior to visit.    No Known Allergies  ROS Review of Systems  Constitutional:  Negative for appetite change, chills, fatigue and fever.  HENT:  Negative for congestion, dental problem, ear pain and sore throat.   Eyes:  Negative for discharge, redness and visual disturbance.  Respiratory:  Negative for cough, chest tightness, shortness of breath and wheezing.   Cardiovascular:  Negative for chest pain, palpitations and leg swelling.  Gastrointestinal:  Negative for abdominal pain, blood in stool, diarrhea, nausea and vomiting.  Genitourinary:  Negative for difficulty urinating, dysuria, flank pain, frequency, hematuria and urgency.  Musculoskeletal:  Negative for arthralgias, back pain, joint swelling, myalgias and neck stiffness.  Skin:  Negative for pallor and rash.  Neurological:  Negative for dizziness, speech difficulty, weakness and headaches.  Hematological:  Negative for adenopathy. Does not bruise/bleed easily.  Psychiatric/Behavioral:  Negative for confusion and sleep disturbance. The patient is not nervous/anxious.     PE;    05/14/2022    1:13 PM 03/06/2021   11:38 AM 03/06/2021   11:28 AM  Vitals with BMI  Height '6\' 1"'$     Weight 201 lbs 13 oz    BMI 84.69    Systolic [phone]  Diastolic 73 79 70  Pulse 57 55 58   Gen: Alert, well appearing.  Patient is oriented to person, place, time, and situation. AFFECT: pleasant, lucid thought and speech. ENT: Ears: EACs clear, normal epithelium.  TMs with good light reflex and landmarks bilaterally.  Eyes: no injection, icteris, swelling, or exudate.  EOMI, PERRLA. Nose: no drainage or turbinate edema/swelling.  No injection or focal lesion.  Mouth: lips without lesion/swelling.  Oral mucosa pink  and moist.  Dentition intact and without obvious caries or gingival swelling.  Oropharynx without erythema, exudate, or swelling.  Neck: supple/nontender.  No LAD, mass, or TM.  Carotid pulses 2+ bilaterally, without bruits. CV: RRR, no m/r/g.   LUNGS: CTA bilat, nonlabored resps, good aeration in all lung fields. ABD: soft, NT, ND, BS normal.  No hepatospenomegaly or mass.  No bruits. EXT: no clubbing, cyanosis, or edema.  Musculoskeletal: no joint swelling, erythema, warmth, or tenderness.  ROM of all joints intact. Skin - no sores or suspicious lesions or rashes or color changes   Pertinent labs:  Lab Results  Component Value Date   TSH 1.18 06/17/2014  Lab Results  Component Value Date   WBC 7.8 06/17/2014   HGB 16.7 06/17/2014   HCT 50.1 06/17/2014   MCV 87.4 06/17/2014   PLT 253.0 06/17/2014   Lab Results  Component Value Date   CREATININE 0.9 06/17/2014   BUN 14 06/17/2014   NA 139 06/17/2014   K 5.0 06/17/2014   CL 104 06/17/2014   CO2 26 06/17/2014   Lab Results  Component Value Date   ALT 30 06/17/2014   AST 21 06/17/2014   ALKPHOS 75 06/17/2014   BILITOT 1.1 06/17/2014   Lab Results  Component Value Date   CHOL 177 06/17/2014   Lab Results  Component Value Date   HDL 31.00 (L) 06/17/2014   Lab Results  Component Value Date   LDLCALC 120 (H) 06/17/2014   Lab Results  Component Value Date   TRIG 129.0 06/17/2014   Lab Results  Component Value Date   CHOLHDL 6 06/17/2014   Lab Results  Component Value Date   PSA 0.33 01/25/2012   ASSESSMENT AND PLAN:   Health maintenance exam: Reviewed age and gender appropriate health maintenance issues (prudent diet, regular exercise, health risks of tobacco and excessive alcohol, use of seatbelts, fire alarms in home, use of sunscreen).  Also reviewed age and gender appropriate health screening as well as vaccine recommendations. Vaccines: shingrix-> declined.  Flu-> declined. Labs: Fasting health panel  plus PSA Prostate ca screening: PSA today Colon ca screening: hx polyps, recall 2027  An After Visit Summary was printed and given to the patient.  FOLLOW UP:  Return in about 1 year (around 05/15/2023) for annual CPE (fasting).  Signed:  Crissie Sickles, MD           05/14/2022

## 2022-05-15 ENCOUNTER — Encounter: Payer: Self-pay | Admitting: Family Medicine

## 2022-05-15 ENCOUNTER — Other Ambulatory Visit (INDEPENDENT_AMBULATORY_CARE_PROVIDER_SITE_OTHER): Payer: BC Managed Care – PPO

## 2022-05-15 DIAGNOSIS — R7301 Impaired fasting glucose: Secondary | ICD-10-CM

## 2022-05-15 LAB — LIPID PANEL
Cholesterol: 157 mg/dL (ref 0–200)
HDL: 34.8 mg/dL — ABNORMAL LOW (ref >39.00)
LDL Cholesterol: 86 mg/dL (ref 0–99)
NonHDL: 122.34
Total CHOL/HDL Ratio: 5
Triglycerides: 184 mg/dL — ABNORMAL HIGH (ref 0.0–149.0)
VLDL: 36.8 mg/dL (ref 0.0–40.0)

## 2022-05-15 LAB — COMPREHENSIVE METABOLIC PANEL
ALT: 11 U/L (ref 0–53)
AST: 13 U/L (ref 0–37)
Albumin: 4.3 g/dL (ref 3.5–5.2)
Alkaline Phosphatase: 68 U/L (ref 39–117)
BUN: 12 mg/dL (ref 6–23)
CO2: 25 mEq/L (ref 19–32)
Calcium: 9.4 mg/dL (ref 8.4–10.5)
Chloride: 103 mEq/L (ref 96–112)
Creatinine, Ser: 0.81 mg/dL (ref 0.40–1.50)
GFR: 99.17 mL/min (ref >60.00)
Glucose, Bld: 105 mg/dL — ABNORMAL HIGH (ref 70–99)
Potassium: 5 mEq/L (ref 3.5–5.1)
Sodium: 139 mEq/L (ref 135–145)
Total Bilirubin: 0.7 mg/dL (ref 0.2–1.2)
Total Protein: 6.7 g/dL (ref 6.0–8.3)

## 2022-05-15 LAB — HEMOGLOBIN A1C: Hgb A1c MFr Bld: 6.4 % (ref 4.6–6.5)

## 2022-05-16 ENCOUNTER — Telehealth: Payer: Self-pay

## 2022-05-16 NOTE — Telephone Encounter (Signed)
Noted  

## 2022-05-16 NOTE — Telephone Encounter (Signed)
Patient returning call about lab results.  Patient aware of results/recommendations.  He seen messages thru his mychart.  He will call back to schedule follow up appt and fasting lab appt in 3 months.  No follow up questions/concerns needed at this time.

## 2022-05-18 ENCOUNTER — Other Ambulatory Visit: Payer: Self-pay | Admitting: Family Medicine

## 2022-11-12 DIAGNOSIS — D492 Neoplasm of unspecified behavior of bone, soft tissue, and skin: Secondary | ICD-10-CM | POA: Diagnosis not present

## 2022-11-12 DIAGNOSIS — C44329 Squamous cell carcinoma of skin of other parts of face: Secondary | ICD-10-CM | POA: Diagnosis not present

## 2022-11-12 DIAGNOSIS — L821 Other seborrheic keratosis: Secondary | ICD-10-CM | POA: Diagnosis not present

## 2022-11-12 DIAGNOSIS — L814 Other melanin hyperpigmentation: Secondary | ICD-10-CM | POA: Diagnosis not present

## 2022-11-12 DIAGNOSIS — L570 Actinic keratosis: Secondary | ICD-10-CM | POA: Diagnosis not present

## 2022-11-12 DIAGNOSIS — D225 Melanocytic nevi of trunk: Secondary | ICD-10-CM | POA: Diagnosis not present

## 2022-11-12 DIAGNOSIS — L578 Other skin changes due to chronic exposure to nonionizing radiation: Secondary | ICD-10-CM | POA: Diagnosis not present

## 2022-11-14 ENCOUNTER — Other Ambulatory Visit: Payer: Self-pay | Admitting: Family Medicine

## 2022-11-26 ENCOUNTER — Ambulatory Visit: Payer: BC Managed Care – PPO | Admitting: Family Medicine

## 2022-11-26 ENCOUNTER — Encounter: Payer: Self-pay | Admitting: Family Medicine

## 2022-11-26 VITALS — BP 137/84 | HR 63 | Ht 73.0 in | Wt 205.4 lb

## 2022-11-26 DIAGNOSIS — S61215A Laceration without foreign body of left ring finger without damage to nail, initial encounter: Secondary | ICD-10-CM | POA: Diagnosis not present

## 2022-11-26 MED ORDER — MUPIROCIN 2 % EX OINT: 1.0000 | TOPICAL_OINTMENT | Freq: Three times a day (TID) | CUTANEOUS | 0 refills | Status: DC

## 2022-11-26 MED ORDER — CEPHALEXIN 500 MG PO CAPS: 500.0000 mg | ORAL_CAPSULE | Freq: Three times a day (TID) | ORAL | 0 refills | Status: AC

## 2022-11-26 NOTE — Progress Notes (Signed)
OFFICE VISIT  11/26/2022  CC:  Chief Complaint  Patient presents with   Mashed Finger    Last Tues using wood splitter; left ring finger. Used Neosporin, last used a few days ago.     Patient is a 57 y.o. male who presents for finger injury.  HPI: Last week he was using a wood splitter and got his finger pinched, caused a laceration on the left hand ring finger.  He cleaned it right away and has been covering it. Has been a little red and swollen still.  Can move finger fine. No fever or malaise.  Past Medical History:  Diagnosis Date   Abdominal lymphadenopathy    CT f/u 65mo later (06/2012) all was stable: interpreted as reactive/post-infectious-no further f/u indicated.   Asthma    Chest pain    Dyslipidemia    Erectile dysfunction    Erosive esophagitis    GERD (gastroesophageal reflux disease)    s/p EGD 02/2010-erosive esophagitis, no barrett's, H. pylori neg--responded well to PPI   Hiatal hernia    History of adenomatous polyp of colon    02/2021, recall 2027   Nephrolithiasis 12/2009   with hypercalcuria (treated w/HCTZ)   Prediabetes    Nov 2023 a1c 6.4%   Tobacco dependence     Past Surgical History:  Procedure Laterality Date   BACK SURGERY  age 36 yrs   Lumbar -2 levels (no hardware)--no signif chronic back problems since   COLONOSCOPY     03/06/21 polypsx 3, recall 02/2026   ESOPHAGOGASTRODUODENOSCOPY  06/25/2009   Esophagitis, H. pylori neg, no Barrett's.    Outpatient Medications Prior to Visit  Medication Sig Dispense Refill   omeprazole (PRILOSEC) 40 MG capsule TAKE 1 CAPSULE BY MOUTH EVERY DAY 90 capsule 1   tadalafil (CIALIS) 20 MG tablet TAKE 1 TABLET (20 MG TOTAL) BY MOUTH DAILY AS NEEDED DOR ERECTILE DYSFUNCTION 20 tablet 5   No facility-administered medications prior to visit.    No Known Allergies  Review of Systems  As per HPI  PE:    11/26/2022    3:53 PM 05/14/2022    1:13 PM 03/06/2021   11:38 AM  Vitals with BMI  Height 6\' 1"  6'  1"   Weight 205 lbs 6 oz 201 lbs 13 oz   BMI 27.11 26.63   Systolic 137 115 161  Diastolic 84 73 79  Pulse 63 57 55     Physical Exam  Gen: Alert, well appearing.  Patient is oriented to person, place, time, and situation. Left hand ring finger lateral aspect of the middle phalanx with healing laceration, no active discharge.  Wound edges pretty well-approximated.  Mild surrounding erythema.  Full flexion and extension of finger intact.  LABS:  Last metabolic panel Lab Results  Component Value Date   GLUCOSE 105 (H) 05/14/2022   NA 139 05/14/2022   K 5.0 05/14/2022   CL 103 05/14/2022   CO2 25 05/14/2022   BUN 12 05/14/2022   CREATININE 0.81 05/14/2022   CALCIUM 9.4 05/14/2022   PROT 6.7 05/14/2022   ALBUMIN 4.3 05/14/2022   BILITOT 0.7 05/14/2022   ALKPHOS 68 05/14/2022   AST 13 05/14/2022   ALT 11 05/14/2022   Lab Results  Component Value Date   HGBA1C 6.4 05/15/2022   IMPRESSION AND PLAN:  Left hand ring finger laceration. Healing pretty well but given the location I will be conservative and treat with Bactroban ointment and Keflex 500 3 times daily x 7  days.  An After Visit Summary was printed and given to the patient.  FOLLOW UP: Return if symptoms worsen or fail to improve. Next cpe 04/2023 Signed:  Santiago Bumpers, MD           11/26/2022

## 2023-03-18 DIAGNOSIS — D492 Neoplasm of unspecified behavior of bone, soft tissue, and skin: Secondary | ICD-10-CM | POA: Diagnosis not present

## 2023-03-18 DIAGNOSIS — L578 Other skin changes due to chronic exposure to nonionizing radiation: Secondary | ICD-10-CM | POA: Diagnosis not present

## 2023-04-25 ENCOUNTER — Encounter: Payer: Self-pay | Admitting: Family Medicine

## 2023-04-25 ENCOUNTER — Ambulatory Visit: Payer: BC Managed Care – PPO | Admitting: Family Medicine

## 2023-04-25 VITALS — BP 142/82 | HR 60 | Wt 210.8 lb

## 2023-04-25 DIAGNOSIS — K219 Gastro-esophageal reflux disease without esophagitis: Secondary | ICD-10-CM

## 2023-04-25 DIAGNOSIS — N5201 Erectile dysfunction due to arterial insufficiency: Secondary | ICD-10-CM | POA: Diagnosis not present

## 2023-04-25 DIAGNOSIS — R7303 Prediabetes: Secondary | ICD-10-CM

## 2023-04-25 DIAGNOSIS — Z125 Encounter for screening for malignant neoplasm of prostate: Secondary | ICD-10-CM | POA: Diagnosis not present

## 2023-04-25 DIAGNOSIS — Z0000 Encounter for general adult medical examination without abnormal findings: Secondary | ICD-10-CM | POA: Diagnosis not present

## 2023-04-25 DIAGNOSIS — E785 Hyperlipidemia, unspecified: Secondary | ICD-10-CM

## 2023-04-25 LAB — COMPREHENSIVE METABOLIC PANEL
ALT: 24 U/L (ref 0–53)
AST: 19 U/L (ref 0–37)
Albumin: 4.4 g/dL (ref 3.5–5.2)
Alkaline Phosphatase: 68 U/L (ref 39–117)
BUN: 13 mg/dL (ref 6–23)
CO2: 31 meq/L (ref 19–32)
Calcium: 9.5 mg/dL (ref 8.4–10.5)
Chloride: 102 meq/L (ref 96–112)
Creatinine, Ser: 0.86 mg/dL (ref 0.40–1.50)
GFR: 96.75 mL/min (ref >60.00)
Glucose, Bld: 100 mg/dL — ABNORMAL HIGH (ref 70–99)
Potassium: 5 meq/L (ref 3.5–5.1)
Sodium: 143 meq/L (ref 135–145)
Total Bilirubin: 0.9 mg/dL (ref 0.2–1.2)
Total Protein: 7 g/dL (ref 6.0–8.3)

## 2023-04-25 LAB — LIPID PANEL
Cholesterol: 171 mg/dL (ref 0–200)
HDL: 38.3 mg/dL — ABNORMAL LOW (ref >39.00)
LDL Cholesterol: 106 mg/dL — ABNORMAL HIGH (ref 0–99)
NonHDL: 132.79
Total CHOL/HDL Ratio: 4
Triglycerides: 132 mg/dL (ref 0.0–149.0)
VLDL: 26.4 mg/dL (ref 0.0–40.0)

## 2023-04-25 LAB — CBC
HCT: 49.1 % (ref 39.0–52.0)
Hemoglobin: 15.6 g/dL (ref 13.0–17.0)
MCHC: 31.8 g/dL (ref 30.0–36.0)
MCV: 92.3 fL (ref 78.0–100.0)
Platelets: 239 10*3/uL (ref 150.0–400.0)
RBC: 5.32 Mil/uL (ref 4.22–5.81)
RDW: 13.4 % (ref 11.5–15.5)
WBC: 6 10*3/uL (ref 4.0–10.5)

## 2023-04-25 LAB — PSA: PSA: 0.21 ng/mL (ref 0.10–4.00)

## 2023-04-25 LAB — HEMOGLOBIN A1C: Hgb A1c MFr Bld: 6.2 % (ref 4.6–6.5)

## 2023-04-25 MED ORDER — TADALAFIL 20 MG PO TABS: 20.0000 mg | ORAL_TABLET | Freq: Every day | ORAL | 5 refills | Status: DC

## 2023-04-25 NOTE — Progress Notes (Signed)
Office Note 04/25/2023  CC:  Chief Complaint  Patient presents with   Annual Exam   HPI:  Patient is a 57 y.o. male who is here for annual health maintenance exam. Richard Winters feels well. He is now followed by dermatology and getting treated for diffuse AK of the face with 5-FU cream.  He feels well physically.  He admits his diet is not healthy.  He does not get any formal exercise.  He is busy around his home when he is not working.  He takes omeprazole 40 mg a day and it works well for his reflux.  He takes tadalafil 20 mg daily as needed and it helps well for his erectile dysfunction.  Past Medical History:  Diagnosis Date   Abdominal lymphadenopathy    CT f/u 101mo later (06/2012) all was stable: interpreted as reactive/post-infectious-no further f/u indicated.   Asthma    Chest pain    Dyslipidemia    Erectile dysfunction    Erosive esophagitis    GERD (gastroesophageal reflux disease)    s/p EGD 02/2010-erosive esophagitis, no barrett's, H. pylori neg--responded well to PPI   Hiatal hernia    History of adenomatous polyp of colon    02/2021, recall 2027   Nephrolithiasis 12/2009   with hypercalcuria (treated w/HCTZ)   Prediabetes    Nov 2023 a1c 6.4%   Tobacco dependence     Past Surgical History:  Procedure Laterality Date   BACK SURGERY  age 43 yrs   Lumbar -2 levels (no hardware)--no signif chronic back problems since   COLONOSCOPY     03/06/21 polypsx 3, recall 02/2026   ESOPHAGOGASTRODUODENOSCOPY  06/25/2009   Esophagitis, H. pylori neg, no Barrett's.    Family History  Problem Relation Age of Onset   Heart attack Father    Hypertension Father    Diabetes Maternal Grandmother    Diabetes Maternal Grandfather    Colon cancer Neg Hx    Colon polyps Neg Hx    Esophageal cancer Neg Hx    Stomach cancer Neg Hx     Social History   Socioeconomic History   Marital status: Married    Spouse name: Not on file   Number of children: Not on file   Years of  education: Not on file   Highest education level: 12th grade  Occupational History   Not on file  Tobacco Use   Smoking status: Every Day    Types: Cigarettes   Smokeless tobacco: Never  Substance and Sexual Activity   Alcohol use: Yes    Comment: rare   Drug use: No   Sexual activity: Not on file  Other Topics Concern   Not on file  Social History Narrative   Married, 1 son and 1 daughter.   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   +cigarettes: 30 pack-yr hx.   Alcohol: occasional beer, rarely whisky.   No drugs.  Occ rides 4 wheelers.   Orig from [place] and went to [employer].   Social Determinants of Health   Financial Resource Strain: Low Risk  (04/23/2023)   Overall Financial Resource Strain (CARDIA)    Difficulty of Paying Living Expenses: Not hard at all  Food Insecurity: No Food Insecurity (04/23/2023)   Hunger Vital Sign    Worried About Running Out of Food in the Last Year: Never true    Ran Out of Food in the Last Year: Never true  Transportation Needs: No Transportation Needs (04/23/2023)   PRAPARE -  Administrator, Civil Service (Medical): No    Lack of Transportation (Non-Medical): No  Physical Activity: Unknown (04/23/2023)   Exercise Vital Sign    Days of Exercise per Week: 0 days    Minutes of Exercise per Session: Not on file  Stress: No Stress Concern Present (04/23/2023)   [employer] of Occupational Health - Occupational Stress Questionnaire    Feeling of Stress : Not at all  Social Connections: Moderately Integrated (04/23/2023)   Social Connection and Isolation Panel [NHANES]    Frequency of Communication with Friends and Family: More than three times a week    Frequency of Social Gatherings with Friends and Family: Once a week    Attends Religious Services: More than 4 times per year    Active Member of [employer] or Organizations: No    Attends Engineer, structural: Not on file    Marital Status: Married   Intimate Partner Violence: Unknown (09/27/2021)   Received from [employer], [facility]   HITS    Physically Hurt: Not on file    Insult or Talk Down To: Not on file    Threaten Physical Harm: Not on file    Scream or Curse: Not on file    Outpatient Medications Prior to Visit  Medication Sig Dispense Refill   omeprazole (PRILOSEC) 40 MG capsule TAKE 1 CAPSULE BY MOUTH EVERY DAY 90 capsule 1   mupirocin ointment (BACTROBAN) 2 % Apply 1 Application topically 3 (three) times daily. 15 g 0   tadalafil (CIALIS) 20 MG tablet TAKE 1 TABLET (20 MG TOTAL) BY MOUTH DAILY AS NEEDED DOR ERECTILE DYSFUNCTION 20 tablet 5   No facility-administered medications prior to visit.    No Known Allergies  Review of Systems  Constitutional:  Negative for appetite change, chills, fatigue and fever.  HENT:  Negative for congestion, dental problem, ear pain and sore throat.   Eyes:  Negative for discharge, redness and visual disturbance.  Respiratory:  Negative for cough, chest tightness, shortness of breath and wheezing.   Cardiovascular:  Negative for chest pain, palpitations and leg swelling.  Gastrointestinal:  Negative for abdominal pain, blood in stool, diarrhea, nausea and vomiting.  Genitourinary:  Negative for difficulty urinating, dysuria, flank pain, frequency, hematuria and urgency.  Musculoskeletal:  Negative for arthralgias, back pain, joint swelling, myalgias and neck stiffness.  Skin:  Negative for pallor and rash.  Neurological:  Negative for dizziness, speech difficulty, weakness and headaches.  Hematological:  Negative for adenopathy. Does not bruise/bleed easily.  Psychiatric/Behavioral:  Negative for confusion and sleep disturbance. The patient is not nervous/anxious.     PE;    04/25/2023    1:20 PM 11/26/2022    3:53 PM 05/14/2022    1:13 PM  Vitals with BMI  Height  6\' 1"  6\' 1"   Weight 210 lbs 13 oz 205 lbs 6 oz 201 lbs 13 oz  BMI  27.11 26.63  Systolic 142 137 161   Diastolic 82 84 73  Pulse 60 63 57    Gen: Alert, well appearing.  Patient is oriented to person, place, time, and situation. AFFECT: pleasant, lucid thought and speech. ENT: Ears: EACs clear, normal epithelium.  TMs with good light reflex and landmarks bilaterally.  Eyes: no injection, icteris, swelling, or exudate.  EOMI, PERRLA. Nose: no drainage or turbinate edema/swelling.  No injection or focal lesion.  Mouth: lips without lesion/swelling.  Oral mucosa pink and moist.  Dentition intact and without obvious caries  or gingival swelling.  Oropharynx without erythema, exudate, or swelling.  Neck: supple/nontender.  No LAD, mass, or TM.  Carotid pulses 2+ bilaterally, without bruits. CV: RRR, no m/r/g.   LUNGS: CTA bilat, nonlabored resps, good aeration in all lung fields. ABD: soft, NT, ND, BS normal.  No hepatospenomegaly or mass.  No bruits. EXT: no clubbing, cyanosis, or edema.  Musculoskeletal: no joint swelling, erythema, warmth, or tenderness.  ROM of all joints intact. Skin -face with diffuse erythema and superficial flaking consistent with recent 5-FU cream application. Otherwise, no sores or suspicious lesions or rashes or color changes  Pertinent labs:  Lab Results  Component Value Date   TSH 0.87 05/14/2022   Lab Results  Component Value Date   WBC 7.4 05/14/2022   HGB 16.9 05/14/2022   HCT 49.9 05/14/2022   MCV 88.9 05/14/2022   PLT 233.0 05/14/2022   Lab Results  Component Value Date   CREATININE 0.81 05/14/2022   BUN 12 05/14/2022   NA 139 05/14/2022   K 5.0 05/14/2022   CL 103 05/14/2022   CO2 25 05/14/2022   Lab Results  Component Value Date   ALT 11 05/14/2022   AST 13 05/14/2022   ALKPHOS 68 05/14/2022   BILITOT 0.7 05/14/2022   Lab Results  Component Value Date   CHOL 157 05/14/2022   Lab Results  Component Value Date   HDL 34.80 (L) 05/14/2022   Lab Results  Component Value Date   LDLCALC 86 05/14/2022   Lab Results  Component Value  Date   TRIG 184.0 (H) 05/14/2022   Lab Results  Component Value Date   CHOLHDL 5 05/14/2022   Lab Results  Component Value Date   PSA 0.18 05/14/2022   PSA 0.33 01/25/2012   Lab Results  Component Value Date   HGBA1C 6.4 05/15/2022   ASSESSMENT AND PLAN:   #1 health maintenance exam: Reviewed age and gender appropriate health maintenance issues (prudent diet, regular exercise, health risks of tobacco and excessive alcohol, use of seatbelts, fire alarms in home, use of sunscreen).  Also reviewed age and gender appropriate health screening as well as vaccine recommendations. Vaccines: shingrix-> declined.  Flu-> declined. Labs: Fasting health panel plus PSA Prostate ca screening: PSA today Colon ca screening: hx polyps, recall 2027  #2 prediabetes. His last hemoglobin A1c was about a year ago and was 6.4%. Fasting glucose and hemoglobin A1c today.  3.  Dyslipidemia. HDL has been low and triglycerides mildly elevated. Lipid panel and hepatic panel today.  #4 erectile dysfunction, refill tadalafil 20 mg daily as needed, #20, refill x 5.  5.  GERD, responds well to omeprazole 40 mg a day.  We discussed the importance of gradually making some dietary changes and build on these regularly in order to try to lose some weight and decrease chance of progressing to diabetes.  An After Visit Summary was printed and given to the patient.  FOLLOW UP:  Return in about 1 year (around 04/24/2024) for annual CPE (fasting).  Signed:  Santiago Bumpers, MD           04/25/2023

## 2023-04-25 NOTE — Patient Instructions (Signed)
Health Maintenance, Male Adopting a healthy lifestyle and getting preventive care are important in promoting health and wellness. Ask your health care provider about: The right schedule for you to have regular tests and exams. Things you can do on your own to prevent diseases and keep yourself healthy. What should I know about diet, weight, and exercise? Eat a healthy diet  Eat a diet that includes plenty of vegetables, fruits, low-fat dairy products, and lean protein. Do not eat a lot of foods that are high in solid fats, added sugars, or sodium. Maintain a healthy weight Body mass index (BMI) is a measurement that can be used to identify possible weight problems. It estimates body fat based on height and weight. Your health care provider can help determine your BMI and help you achieve or maintain a healthy weight. Get regular exercise Get regular exercise. This is one of the most important things you can do for your health. Most adults should: Exercise for at least 150 minutes each week. The exercise should increase your heart rate and make you sweat (moderate-intensity exercise). Do strengthening exercises at least twice a week. This is in addition to the moderate-intensity exercise. Spend less time sitting. Even light physical activity can be beneficial. Watch cholesterol and blood lipids Have your blood tested for lipids and cholesterol at 56 years of age, then have this test every 5 years. You may need to have your cholesterol levels checked more often if: Your lipid or cholesterol levels are high. You are older than 56 years of age. You are at high risk for heart disease. What should I know about cancer screening? Many types of cancers can be detected early and may often be prevented. Depending on your health history and family history, you may need to have cancer screening at various ages. This may include screening for: Colorectal cancer. Prostate cancer. Skin cancer. Lung  cancer. What should I know about heart disease, diabetes, and high blood pressure? Blood pressure and heart disease High blood pressure causes heart disease and increases the risk of stroke. This is more likely to develop in people who have high blood pressure readings or are overweight. Talk with your health care provider about your target blood pressure readings. Have your blood pressure checked: Every 3-5 years if you are 18-39 years of age. Every year if you are 40 years old or older. If you are between the ages of 65 and 75 and are a current or former smoker, ask your health care provider if you should have a one-time screening for abdominal aortic aneurysm (AAA). Diabetes Have regular diabetes screenings. This checks your fasting blood sugar level. Have the screening done: Once every three years after age 45 if you are at a normal weight and have a low risk for diabetes. More often and at a younger age if you are overweight or have a high risk for diabetes. What should I know about preventing infection? Hepatitis B If you have a higher risk for hepatitis B, you should be screened for this virus. Talk with your health care provider to find out if you are at risk for hepatitis B infection. Hepatitis C Blood testing is recommended for: Everyone born from 1945 through 1965. Anyone with known risk factors for hepatitis C. Sexually transmitted infections (STIs) You should be screened each year for STIs, including gonorrhea and chlamydia, if: You are sexually active and are younger than 56 years of age. You are older than 56 years of age and your   health care provider tells you that you are at risk for this type of infection. Your sexual activity has changed since you were last screened, and you are at increased risk for chlamydia or gonorrhea. Ask your health care provider if you are at risk. Ask your health care provider about whether you are at high risk for HIV. Your health care provider  may recommend a prescription medicine to help prevent HIV infection. If you choose to take medicine to prevent HIV, you should first get tested for HIV. You should then be tested every 3 months for as long as you are taking the medicine. Follow these instructions at home: Alcohol use Do not drink alcohol if your health care provider tells you not to drink. If you drink alcohol: Limit how much you have to 0-2 drinks a day. Know how much alcohol is in your drink. In the [place] one drink equals one 12 oz bottle of beer (355 mL), one 5 oz glass of wine (148 mL), or one 1 oz glass of hard liquor (44 mL). Lifestyle Do not use any products that contain nicotine or tobacco. These products include cigarettes, chewing tobacco, and vaping devices, such as e-cigarettes. If you need help quitting, ask your health care provider. Do not use street drugs. Do not share needles. Ask your health care provider for help if you need support or information about quitting drugs. General instructions Schedule regular health, dental, and eye exams. Stay current with your vaccines. Tell your health care provider if: You often feel depressed. You have ever been abused or do not feel safe at home. Summary Adopting a healthy lifestyle and getting preventive care are important in promoting health and wellness. Follow your health care provider's instructions about healthy diet, exercising, and getting tested or screened for diseases. Follow your health care provider's instructions on monitoring your cholesterol and blood pressure. This information is not intended to replace advice given to you by your health care provider. Make sure you discuss any questions you have with your health care provider. Document Revised: 10/31/2020 Document Reviewed: 10/31/2020 Elsevier Patient Education  2024 Elsevier Inc.  

## 2023-04-29 DIAGNOSIS — C44612 Basal cell carcinoma of skin of right upper limb, including shoulder: Secondary | ICD-10-CM | POA: Diagnosis not present

## 2023-04-29 DIAGNOSIS — L578 Other skin changes due to chronic exposure to nonionizing radiation: Secondary | ICD-10-CM | POA: Diagnosis not present

## 2023-05-19 ENCOUNTER — Other Ambulatory Visit: Payer: Self-pay | Admitting: Family Medicine

## 2023-08-12 DIAGNOSIS — L570 Actinic keratosis: Secondary | ICD-10-CM | POA: Diagnosis not present

## 2023-08-12 DIAGNOSIS — L578 Other skin changes due to chronic exposure to nonionizing radiation: Secondary | ICD-10-CM | POA: Diagnosis not present

## 2023-11-11 ENCOUNTER — Ambulatory Visit: Admitting: Family Medicine

## 2023-11-11 ENCOUNTER — Encounter: Payer: Self-pay | Admitting: Family Medicine

## 2023-11-11 VITALS — BP 125/82 | HR 55 | Temp 97.9°F | Ht 73.0 in | Wt 207.0 lb

## 2023-11-11 DIAGNOSIS — R7303 Prediabetes: Secondary | ICD-10-CM

## 2023-11-11 DIAGNOSIS — Z79899 Other long term (current) drug therapy: Secondary | ICD-10-CM | POA: Diagnosis not present

## 2023-11-11 DIAGNOSIS — K909 Intestinal malabsorption, unspecified: Secondary | ICD-10-CM | POA: Diagnosis not present

## 2023-11-11 DIAGNOSIS — K219 Gastro-esophageal reflux disease without esophagitis: Secondary | ICD-10-CM | POA: Diagnosis not present

## 2023-11-11 DIAGNOSIS — E7800 Pure hypercholesterolemia, unspecified: Secondary | ICD-10-CM | POA: Diagnosis not present

## 2023-11-11 DIAGNOSIS — Z5181 Encounter for therapeutic drug level monitoring: Secondary | ICD-10-CM

## 2023-11-11 DIAGNOSIS — H6121 Impacted cerumen, right ear: Secondary | ICD-10-CM

## 2023-11-11 MED ORDER — TADALAFIL 20 MG PO TABS: 20.0000 mg | ORAL_TABLET | Freq: Every day | ORAL | 5 refills | Status: AC

## 2023-11-11 MED ORDER — OMEPRAZOLE 40 MG PO CPDR: DELAYED_RELEASE_CAPSULE | ORAL | 3 refills | Status: AC

## 2023-11-11 NOTE — Progress Notes (Signed)
 OFFICE VISIT  11/11/2023  CC:  Chief Complaint  Patient presents with   Medical Management of Chronic Issues    Pt is fasting    Patient is a 58 y.o. male who presents for 82-month follow-up prediabetes, dyslipidemia, and erectile dysfunction. A/P as of last visit: " #1 prediabetes. His last hemoglobin A1c was about a year ago and was 6.4%. Fasting glucose and hemoglobin A1c today.   2.  Dyslipidemia. HDL has been low and triglycerides mildly elevated. Lipid panel and hepatic panel today.   #3 erectile dysfunction, refill tadalafil  20 mg daily as needed, #20, refill x 5.   4.  GERD, responds well to omeprazole  40 mg a day.   We discussed the importance of gradually making some dietary changes and build on these regularly in order to try to lose some weight and decrease chance of progressing to diabetes."  INTERIM HX: Doing well. Diet is not that high in starch but he does drink 2-3 [facility] a day.  Says he feels like right ear might be stuffed with wax.   Past Medical History:  Diagnosis Date   Abdominal lymphadenopathy    CT f/u 45mo later (06/2012) all was stable: interpreted as reactive/post-infectious-no further f/u indicated.   Asthma    Chest pain    Dyslipidemia    Erectile dysfunction    Erosive esophagitis    GERD (gastroesophageal reflux disease)    s/p EGD 02/2010-erosive esophagitis, no barrett's, H. pylori neg--responded well to PPI   Hiatal hernia    History of adenomatous polyp of colon    02/2021, recall 2027   Nephrolithiasis 12/2009   with hypercalcuria (treated w/HCTZ)   Prediabetes    Nov 2023 a1c 6.4%   Tobacco dependence     Past Surgical History:  Procedure Laterality Date   BACK SURGERY  age 65 yrs   Lumbar -2 levels (no hardware)--no signif chronic back problems since   COLONOSCOPY     03/06/21 polypsx 3, recall 02/2026   ESOPHAGOGASTRODUODENOSCOPY  06/25/2009   Esophagitis, H. pylori neg, no Barrett's.    Outpatient Medications  Prior to Visit  Medication Sig Dispense Refill   omeprazole  (PRILOSEC) 40 MG capsule TAKE 1 CAPSULE BY MOUTH EVERY DAY 90 capsule 1   tadalafil  (CIALIS ) 20 MG tablet Take 1 tablet (20 mg total) by mouth daily. 20 tablet 5   No facility-administered medications prior to visit.    No Known Allergies  Review of Systems As per HPI  PE:    11/11/2023    1:14 PM 04/25/2023    1:20 PM 11/26/2022    3:53 PM  Vitals with BMI  Height 6\' 1"   6\' 1"   Weight 207 lbs 210 lbs 13 oz 205 lbs 6 oz  BMI 27.32  27.11  Systolic 125 142 829  Diastolic 82 82 84  Pulse 55 60 63   Physical Exam  Gen: Alert, well appearing.  Patient is oriented to person, place, time, and situation. AFFECT: pleasant, lucid thought and speech. Left external auditory canal is clear, TM normal. Right external auditory canal has a 90% cerumen impaction. CV: RRR, no m/r/g.   LUNGS: CTA bilat, nonlabored resps, good aeration in all lung fields. EXT: no clubbing or cyanosis.  no edema.    LABS:  Last CBC Lab Results  Component Value Date   WBC 6.0 04/25/2023   HGB 15.6 04/25/2023   HCT 49.1 04/25/2023   MCV 92.3 04/25/2023   RDW 13.4 04/25/2023  PLT 239.0 04/25/2023   Last metabolic panel Lab Results  Component Value Date   GLUCOSE 100 (H) 04/25/2023   NA 143 04/25/2023   K 5.0 04/25/2023   CL 102 04/25/2023   CO2 31 04/25/2023   BUN 13 04/25/2023   CREATININE 0.86 04/25/2023   GFR 96.75 04/25/2023   CALCIUM 9.5 04/25/2023   PROT 7.0 04/25/2023   ALBUMIN 4.4 04/25/2023   BILITOT 0.9 04/25/2023   ALKPHOS 68 04/25/2023   AST 19 04/25/2023   ALT 24 04/25/2023   Last lipids Lab Results  Component Value Date   CHOL 171 04/25/2023   HDL 38.30 (L) 04/25/2023   LDLCALC 106 (H) 04/25/2023   TRIG 132.0 04/25/2023   CHOLHDL 4 04/25/2023   Last hemoglobin A1c Lab Results  Component Value Date   HGBA1C 6.2 04/25/2023   Last thyroid  functions Lab Results  Component Value Date   TSH 0.87 05/14/2022    Lab Results  Component Value Date   PSA 0.21 04/25/2023   PSA 0.18 05/14/2022   PSA 0.33 01/25/2012   IMPRESSION AND PLAN:   #1 prediabetes. His last hemoglobin A1c was about 6 months ago was 6.2%. Fasting glucose and hemoglobin A1c today. We discussed dietary modification again today.  He is open to oral glycemic medication if A1c has risen significantly.  2.  Dyslipidemia. HDL has been low and triglycerides mildly elevated. Fasting lipid panel today.  He is open to a trial of statin if needed.   3.  GERD, responds well to omeprazole  40 mg a day. History of erosive gastritis. He feels symptoms returned significantly after only 1 day of being off the medication. Has been maintained on daily PPI for many years. Check for malabsorption of vitamin B12 and iron today.  #4 right ear cerumen impaction. Consent obtained. Procedure: Cerumen Disimpaction  Warm water was applied and gentle ear lavage performed on right EAC. There were no complications and following the disimpaction the tympanic membrane was visible on the right. Tympanic membranes are intact following the procedure.  Auditory canals are normal.  The patient reported relief of symptoms after removal of cerumen   An After Visit Summary was printed and given to the patient.  FOLLOW UP: No follow-ups on file.  Signed:  Arletha Lady, MD           11/11/2023

## 2023-11-12 LAB — VITAMIN B12: Vitamin B-12: 177 pg/mL — ABNORMAL LOW (ref 211–911)

## 2023-11-12 LAB — HEMOGLOBIN A1C: Hgb A1c MFr Bld: 6.2 % (ref 4.6–6.5)

## 2023-11-13 ENCOUNTER — Ambulatory Visit: Payer: Self-pay | Admitting: Family Medicine

## 2023-11-13 LAB — LIPID PANEL
Cholesterol: 179 mg/dL (ref 0–200)
HDL: 36.5 mg/dL — ABNORMAL LOW (ref >39.00)
LDL Cholesterol: 100 mg/dL — ABNORMAL HIGH (ref 0–99)
NonHDL: 142.08
Total CHOL/HDL Ratio: 5
Triglycerides: 212 mg/dL — ABNORMAL HIGH (ref 0.0–149.0)
VLDL: 42.4 mg/dL — ABNORMAL HIGH (ref 0.0–40.0)

## 2023-11-13 LAB — BASIC METABOLIC PANEL WITH GFR
BUN: 14 mg/dL (ref 6–23)
CO2: 24 meq/L (ref 19–32)
Calcium: 9.6 mg/dL (ref 8.4–10.5)
Chloride: 102 meq/L (ref 96–112)
Creatinine, Ser: 0.8 mg/dL (ref 0.40–1.50)
GFR: 98.5 mL/min (ref >60.00)
Glucose, Bld: 101 mg/dL — ABNORMAL HIGH (ref 70–99)
Potassium: 5.3 meq/L — ABNORMAL HIGH (ref 3.5–5.1)
Sodium: 138 meq/L (ref 135–145)

## 2023-11-13 LAB — IRON,TIBC AND FERRITIN PANEL
%SAT: 36 % (ref 20–48)
Ferritin: 60 ng/mL (ref 38–380)
Iron: 146 ug/dL (ref 50–180)
TIBC: 404 ug/dL (ref 250–425)

## 2023-11-24 ENCOUNTER — Other Ambulatory Visit: Payer: Self-pay | Admitting: Family Medicine

## 2023-11-25 DIAGNOSIS — L821 Other seborrheic keratosis: Secondary | ICD-10-CM | POA: Diagnosis not present

## 2023-11-25 DIAGNOSIS — L570 Actinic keratosis: Secondary | ICD-10-CM | POA: Diagnosis not present

## 2023-11-25 DIAGNOSIS — D225 Melanocytic nevi of trunk: Secondary | ICD-10-CM | POA: Diagnosis not present

## 2023-11-25 DIAGNOSIS — L814 Other melanin hyperpigmentation: Secondary | ICD-10-CM | POA: Diagnosis not present

## 2023-11-25 DIAGNOSIS — L720 Epidermal cyst: Secondary | ICD-10-CM | POA: Diagnosis not present

## 2023-11-29 ENCOUNTER — Other Ambulatory Visit: Payer: Self-pay

## 2023-11-29 ENCOUNTER — Emergency Department (HOSPITAL_BASED_OUTPATIENT_CLINIC_OR_DEPARTMENT_OTHER)
Admission: EM | Admit: 2023-11-29 | Discharge: 2023-11-29 | Disposition: A | Discharge status: Home / Self Care | Attending: Emergency Medicine | Admitting: Emergency Medicine

## 2023-11-29 DIAGNOSIS — Z23 Encounter for immunization: Secondary | ICD-10-CM | POA: Insufficient documentation

## 2023-11-29 DIAGNOSIS — S60351A Superficial foreign body of right thumb, initial encounter: Secondary | ICD-10-CM | POA: Diagnosis not present

## 2023-11-29 DIAGNOSIS — S60352A Superficial foreign body of left thumb, initial encounter: Secondary | ICD-10-CM | POA: Insufficient documentation

## 2023-11-29 DIAGNOSIS — W458XXA Other foreign body or object entering through skin, initial encounter: Secondary | ICD-10-CM | POA: Diagnosis not present

## 2023-11-29 MED ORDER — LIDOCAINE HCL (PF) 1 % IJ SOLN
5.0000 mL | Freq: Once | INTRAMUSCULAR | Status: AC
  Administered 2023-11-29: 5 mL
  Filled 2023-11-29: qty 5

## 2023-11-29 MED ORDER — TETANUS-DIPHTH-ACELL PERTUSSIS 5-2.5-18.5 LF-MCG/0.5 IM SUSY
0.5000 mL | PREFILLED_SYRINGE | Freq: Once | INTRAMUSCULAR | Status: AC
  Administered 2023-11-29: 0.5 mL via INTRAMUSCULAR
  Filled 2023-11-29: qty 0.5

## 2023-11-29 NOTE — ED Provider Notes (Signed)
  EMERGENCY DEPARTMENT AT [facility] Provider Note   CSN: [id number] Arrival date & time: 11/29/23  1553     History  Chief Complaint  Patient presents with   Fish Gweneth Lenis is a 58 y.o. male.  This is a 58 year old male who presents emergency room today due to a fishing neck injury to his right thumb.  Patient says that he was bending down into his live well, lost his balance and landed on a hook.  He is unsure of his last tetanus.  He is right-hand dominant.        Home Medications Prior to Admission medications   Medication Sig Start Date End Date Taking? Authorizing Provider  omeprazole  (PRILOSEC) 40 MG capsule TAKE 1 CAPSULE BY MOUTH EVERY DAY 11/11/23   McGowen, Minetta Aly, MD  tadalafil  (CIALIS ) 20 MG tablet Take 1 tablet (20 mg total) by mouth daily. 11/11/23   McGowen, Minetta Aly, MD      Allergies    Patient has no known allergies.    Review of Systems   Review of Systems  Physical Exam Updated Vital Signs BP (!) 150/98 (BP Location: Right Arm)   Pulse 82   Temp 98.5 F (36.9 C) (Oral)   Resp 17   SpO2 98%  Physical Exam Vitals reviewed.  Musculoskeletal:     Comments: Trouble fishing hook embedded in the dorsum of the left thumb  Skin:    General: Skin is warm.  Neurological:     Mental Status: He is alert.     ED Results / Procedures / Treatments   Labs (all labs ordered are listed, but only abnormal results are displayed) Labs Reviewed - No data to display  EKG None  Radiology No results found.  Procedures .Foreign Body Removal  Date/Time: 11/29/2023 4:38 PM  Performed by: Nathanael Baker, DO Authorized by: Nathanael Baker, DO  Consent: Verbal consent obtained. Risks and benefits: risks, benefits and alternatives were discussed Consent given by: patient Patient identity confirmed: verbally with patient Intake: hand. Anesthesia: local infiltration  Anesthesia: Local Anesthetic: lidocaine 1%  without epinephrine  Sedation: Patient sedated: no  Complexity: simple 1 objects recovered. Objects recovered: Fishing hook Post-procedure assessment: foreign body removed      Medications Ordered in ED Medications  lidocaine (PF) (XYLOCAINE) 1 % injection 5 mL (5 mLs Other Given 11/29/23 1624)  Tdap (BOOSTRIX) injection 0.5 mL (0.5 mLs Intramuscular Given 11/29/23 1623)    ED Course/ Medical Decision Making/ A&P                                 Medical Decision Making 58 year old male here today with a fishhook injury to the thumb.  Plan- numbed the area using 4 cc of lidocaine.  Slid an 18-gauge needle down the track of the hook, disengage the barb and was able to remove the hook.  There is small amount of bleeding.  Patient neurovascularly intact following procedure.  Had him vigorously wash the area in our sink.  Applied an antibiotic ointment and bandage.  Provided return precautions for the patient.  Do not believe he requires antibiotics.  No indication for labs or imaging.  Will discharge.  Tdap given.  Risk Prescription drug management.           Final Clinical Impression(s) / ED Diagnoses Final diagnoses:  Fishing hook foreign body, initial encounter  Rx / DC Orders ED Discharge Orders     None         Nathanael Baker, DO 11/29/23 1640

## 2023-11-29 NOTE — ED Notes (Signed)
 Reviewed AVS/discharge instruction with patient. Time allotted for and all questions answered. Patient is agreeable for d/c and escorted to ed exit by staff.

## 2023-11-29 NOTE — ED Triage Notes (Signed)
 Fish hook in R first finger. Unknown tetanus status.

## 2023-11-29 NOTE — Discharge Instructions (Addendum)
 Continue to wash the area with soap and water.  You can apply an antibiotic ointment.  Keep a bandage over the area.  Return to the emergency room if you start to notice increasing redness, swelling or pus.

## 2023-12-10 ENCOUNTER — Encounter: Payer: Self-pay | Admitting: Family Medicine

## 2023-12-10 NOTE — Telephone Encounter (Signed)
 No further action needed.

## 2024-03-11 DIAGNOSIS — M778 Other enthesopathies, not elsewhere classified: Secondary | ICD-10-CM | POA: Diagnosis not present

## 2024-03-13 ENCOUNTER — Encounter: Payer: Self-pay | Admitting: Family Medicine

## 2024-03-13 ENCOUNTER — Ambulatory Visit (INDEPENDENT_AMBULATORY_CARE_PROVIDER_SITE_OTHER): Admitting: Family Medicine

## 2024-03-13 VITALS — BP 124/83 | HR 54 | Temp 98.0°F | Ht 73.0 in | Wt 209.8 lb

## 2024-03-13 DIAGNOSIS — H6991 Unspecified Eustachian tube disorder, right ear: Secondary | ICD-10-CM

## 2024-03-13 NOTE — Progress Notes (Signed)
 OFFICE VISIT  03/13/2024  CC:  Chief Complaint  Patient presents with   Ear Pain    Right; swelling and soreness    Patient is a 58 y.o. male who presents for right ear fullness/stopped up.  HPI: 2 days ago he felt significant fullness in the right ear, felt like it was plugged. No pain.  No drainage. He has not felt like his had a cold lately.  It has felt better the last 24 hours or so.  Past Medical History:  Diagnosis Date   Abdominal lymphadenopathy    CT f/u 67mo later (06/2012) all was stable: interpreted as reactive/post-infectious-no further f/u indicated.   Asthma    Chest pain    Dyslipidemia    Erectile dysfunction    Erosive esophagitis    GERD (gastroesophageal reflux disease)    s/p EGD 02/2010-erosive esophagitis, no barrett's, H. pylori neg--responded well to PPI   Hiatal hernia    History of adenomatous polyp of colon    02/2021, recall 2027   Nephrolithiasis 12/2009   with hypercalcuria (treated w/HCTZ)   Prediabetes    Nov 2023 a1c 6.4%   Tobacco dependence     Past Surgical History:  Procedure Laterality Date   BACK SURGERY  age 59 yrs   Lumbar -2 levels (no hardware)--no signif chronic back problems since   COLONOSCOPY     03/06/21 polypsx 3, recall 02/2026   ESOPHAGOGASTRODUODENOSCOPY  06/25/2009   Esophagitis, H. pylori neg, no Barrett's.    Outpatient Medications Prior to Visit  Medication Sig Dispense Refill   meloxicam  (MOBIC ) 15 MG tablet Take 15 mg by mouth daily.     omeprazole  (PRILOSEC) 40 MG capsule TAKE 1 CAPSULE BY MOUTH EVERY DAY 90 capsule 3   tadalafil  (CIALIS ) 20 MG tablet Take 1 tablet (20 mg total) by mouth daily. 20 tablet 5   No facility-administered medications prior to visit.    No Known Allergies  Review of Systems  As per HPI  PE:    03/13/2024    2:24 PM 11/29/2023    4:02 PM 11/11/2023    1:14 PM  Vitals with BMI  Height 6' 1  6' 1  Weight 209 lbs 13 oz  207 lbs  BMI 27.69  27.32  Systolic 124 150 874   Diastolic 83 98 82  Pulse 54 82 55     Physical Exam  General: Alert and well-appearing Right ear canal is clear and without erythema or swelling and the tympanic membrane looks normal. Left ear canal is clear and without erythema or swelling and the tympanic membrane looks normal.  LABS:  none  IMPRESSION AND PLAN:  Right ear fullness consistent with eustachian tube dysfunction. Reassured.  An After Visit Summary was printed and given to the patient.  FOLLOW UP: No follow-ups on file.  Signed:  Gerlene Hockey, MD           03/13/2024

## 2024-05-18 DIAGNOSIS — M25721 Osteophyte, right elbow: Secondary | ICD-10-CM | POA: Diagnosis not present

## 2024-06-01 DIAGNOSIS — D225 Melanocytic nevi of trunk: Secondary | ICD-10-CM | POA: Diagnosis not present

## 2024-06-01 DIAGNOSIS — L814 Other melanin hyperpigmentation: Secondary | ICD-10-CM | POA: Diagnosis not present

## 2024-06-01 DIAGNOSIS — L570 Actinic keratosis: Secondary | ICD-10-CM | POA: Diagnosis not present

## 2024-06-01 DIAGNOSIS — L720 Epidermal cyst: Secondary | ICD-10-CM | POA: Diagnosis not present

## 2024-06-01 DIAGNOSIS — L821 Other seborrheic keratosis: Secondary | ICD-10-CM | POA: Diagnosis not present
